# Patient Record
Sex: Female | Born: 1940 | Race: Asian | Hispanic: No | Marital: Married | Smoking: Former smoker
Health system: Southern US, Community
[De-identification: ages and names within clinical notes are randomized; demographics above are authoritative.]

## PROBLEM LIST (undated history)

## (undated) DIAGNOSIS — R7303 Prediabetes: Secondary | ICD-10-CM

## (undated) DIAGNOSIS — I442 Atrioventricular block, complete: Secondary | ICD-10-CM

## (undated) DIAGNOSIS — D649 Anemia, unspecified: Secondary | ICD-10-CM

## (undated) DIAGNOSIS — IMO0002 Reserved for concepts with insufficient information to code with codable children: Secondary | ICD-10-CM

## (undated) DIAGNOSIS — Z950 Presence of cardiac pacemaker: Secondary | ICD-10-CM

## (undated) DIAGNOSIS — N183 Chronic kidney disease, stage 3 unspecified: Secondary | ICD-10-CM

## (undated) DIAGNOSIS — K2970 Gastritis, unspecified, without bleeding: Secondary | ICD-10-CM

## (undated) DIAGNOSIS — I5022 Chronic systolic (congestive) heart failure: Secondary | ICD-10-CM

## (undated) DIAGNOSIS — R55 Syncope and collapse: Secondary | ICD-10-CM

## (undated) DIAGNOSIS — E039 Hypothyroidism, unspecified: Secondary | ICD-10-CM

## (undated) DIAGNOSIS — I2510 Atherosclerotic heart disease of native coronary artery without angina pectoris: Secondary | ICD-10-CM

## (undated) DIAGNOSIS — E119 Type 2 diabetes mellitus without complications: Secondary | ICD-10-CM

## (undated) DIAGNOSIS — I340 Nonrheumatic mitral (valve) insufficiency: Secondary | ICD-10-CM

## (undated) DIAGNOSIS — M5410 Radiculopathy, site unspecified: Secondary | ICD-10-CM

## (undated) DIAGNOSIS — I10 Essential (primary) hypertension: Secondary | ICD-10-CM

## (undated) DIAGNOSIS — R9430 Abnormal result of cardiovascular function study, unspecified: Secondary | ICD-10-CM

## (undated) DIAGNOSIS — K760 Fatty (change of) liver, not elsewhere classified: Secondary | ICD-10-CM

## (undated) DIAGNOSIS — I428 Other cardiomyopathies: Secondary | ICD-10-CM

## (undated) HISTORY — DX: Abnormal result of cardiovascular function study, unspecified: R94.30

## (undated) HISTORY — DX: Essential (primary) hypertension: I10

## (undated) HISTORY — DX: Fatty (change of) liver, not elsewhere classified: K76.0

## (undated) HISTORY — DX: Radiculopathy, site unspecified: M54.10

## (undated) HISTORY — DX: Anemia, unspecified: D64.9

## (undated) HISTORY — DX: Other cardiomyopathies: I42.8

## (undated) HISTORY — DX: Syncope and collapse: R55

## (undated) HISTORY — DX: Atrioventricular block, complete: I44.2

## (undated) HISTORY — PX: UPPER GI ENDOSCOPY: SHX6162

## (undated) HISTORY — PX: ROOT CANAL: SHX2363

## (undated) HISTORY — PX: CATARACT EXTRACTION: SUR2

## (undated) HISTORY — DX: Prediabetes: R73.03

## (undated) HISTORY — DX: Presence of cardiac pacemaker: Z95.0

## (undated) HISTORY — DX: Chronic kidney disease, stage 3 (moderate): N18.3

## (undated) HISTORY — DX: Hypothyroidism, unspecified: E03.9

## (undated) HISTORY — DX: Reserved for concepts with insufficient information to code with codable children: IMO0002

## (undated) HISTORY — DX: Nonrheumatic mitral (valve) insufficiency: I34.0

## (undated) HISTORY — DX: Chronic systolic (congestive) heart failure: I50.22

## (undated) HISTORY — PX: ESOPHAGOGASTRODUODENOSCOPY: SHX1529

## (undated) HISTORY — PX: INTRAOCULAR LENS INSERTION: SHX110

## (undated) HISTORY — DX: Gastritis, unspecified, without bleeding: K29.70

## (undated) HISTORY — DX: Type 2 diabetes mellitus without complications: E11.9

## (undated) HISTORY — PX: BREAST ENHANCEMENT SURGERY: SHX7

## (undated) HISTORY — DX: Chronic kidney disease, stage 3 unspecified: N18.30

## (undated) HISTORY — DX: Atherosclerotic heart disease of native coronary artery without angina pectoris: I25.10

---

## 1987-06-21 HISTORY — PX: TOTAL ABDOMINAL HYSTERECTOMY W/ BILATERAL SALPINGOOPHORECTOMY: SHX83

## 2001-10-30 ENCOUNTER — Inpatient Hospital Stay (HOSPITAL_COMMUNITY): Admission: EM | Admit: 2001-10-30 | Discharge: 2001-10-31 | Payer: Self-pay | Admitting: Emergency Medicine

## 2001-10-30 ENCOUNTER — Encounter: Payer: Self-pay | Admitting: Emergency Medicine

## 2001-10-31 ENCOUNTER — Encounter: Payer: Self-pay | Admitting: Internal Medicine

## 2003-06-21 DIAGNOSIS — Z950 Presence of cardiac pacemaker: Secondary | ICD-10-CM

## 2003-06-21 HISTORY — DX: Presence of cardiac pacemaker: Z95.0

## 2003-06-21 HISTORY — PX: PACEMAKER INSERTION: SHX728

## 2003-07-24 ENCOUNTER — Inpatient Hospital Stay (HOSPITAL_COMMUNITY): Admission: EM | Admit: 2003-07-24 | Discharge: 2003-07-26 | Payer: Self-pay

## 2003-07-24 ENCOUNTER — Encounter (INDEPENDENT_AMBULATORY_CARE_PROVIDER_SITE_OTHER): Payer: Self-pay | Admitting: *Deleted

## 2003-10-02 ENCOUNTER — Ambulatory Visit (HOSPITAL_COMMUNITY): Admission: RE | Admit: 2003-10-02 | Discharge: 2003-10-02 | Payer: Self-pay | Admitting: Family Medicine

## 2006-08-03 ENCOUNTER — Inpatient Hospital Stay (HOSPITAL_COMMUNITY): Admission: EM | Admit: 2006-08-03 | Discharge: 2006-08-10 | Payer: Self-pay | Admitting: Emergency Medicine

## 2006-08-03 ENCOUNTER — Ambulatory Visit: Payer: Self-pay | Admitting: Critical Care Medicine

## 2006-08-06 ENCOUNTER — Encounter (INDEPENDENT_AMBULATORY_CARE_PROVIDER_SITE_OTHER): Payer: Self-pay | Admitting: Interventional Cardiology

## 2008-03-18 ENCOUNTER — Encounter: Admission: RE | Admit: 2008-03-18 | Discharge: 2008-03-18 | Payer: Self-pay | Admitting: Cardiology

## 2009-06-19 ENCOUNTER — Emergency Department (HOSPITAL_BASED_OUTPATIENT_CLINIC_OR_DEPARTMENT_OTHER): Admission: EM | Admit: 2009-06-19 | Discharge: 2009-06-19 | Payer: Self-pay | Admitting: Emergency Medicine

## 2010-03-20 HISTORY — PX: DOPPLER ECHOCARDIOGRAPHY: SHX263

## 2010-04-06 ENCOUNTER — Encounter: Admission: RE | Admit: 2010-04-06 | Discharge: 2010-04-06 | Payer: Self-pay | Admitting: Family Medicine

## 2010-07-11 ENCOUNTER — Encounter: Payer: Self-pay | Admitting: Family Medicine

## 2010-09-20 LAB — DIFFERENTIAL
Eosinophils Absolute: 0.4 10*3/uL (ref 0.0–0.7)
Lymphocytes Relative: 34 % (ref 12–46)
Lymphs Abs: 2.5 10*3/uL (ref 0.7–4.0)
Monocytes Relative: 8 % (ref 3–12)
Neutro Abs: 4 10*3/uL (ref 1.7–7.7)
Neutrophils Relative %: 52 % (ref 43–77)

## 2010-09-20 LAB — CBC
MCV: 94.3 fL (ref 78.0–100.0)
Platelets: 273 10*3/uL (ref 150–400)
RBC: 3.77 MIL/uL — ABNORMAL LOW (ref 3.87–5.11)
WBC: 7.6 10*3/uL (ref 4.0–10.5)

## 2010-09-20 LAB — BASIC METABOLIC PANEL
Chloride: 105 mEq/L (ref 96–112)
Creatinine, Ser: 1.5 mg/dL — ABNORMAL HIGH (ref 0.4–1.2)
GFR calc Af Amer: 42 mL/min — ABNORMAL LOW (ref >60)
GFR calc non Af Amer: 35 mL/min — ABNORMAL LOW (ref >60)

## 2010-11-05 NOTE — Consult Note (Signed)
NAME:  Gabriela Phillips, Gabriela Phillips              ACCOUNT NO.:  [id number]   MEDICAL RECORD NO.:  [id number]          PATIENT TYPE:  INP   LOCATION:  2104                         FACILITY:  [facility]   PHYSICIAN:  Charlcie Cradle. Delford Field, MD, FCCPDATE OF BIRTH:  [birth year] 10, 1940   DATE OF CONSULTATION:  08/06/2006  DATE OF DISCHARGE:                                 CONSULTATION   CRITICAL CARE CONSULTATION   CHIEF COMPLAINT:  Respiratory failure.   HISTORY OF PRESENT ILLNESS:  [age]-year-old Asian female presented  to [facility] on February 14, with 3 days of nausea, vomiting and  diarrhea, was hypotensive initially.  She resuscitated well with volume.  She had been on an ACE inhibitor prior to this.  She was stabilized, but  on August 06, 2006, early a.m., she developed increased dyspnea  requiring mechanical ventilation, intubation, transferred to the ICU.  CT of chest did not reveal PE, it did show CHF with right renal pleural  effusions and bilateral asymmetric edema.  She has a history of syncopal  episodes, required pacemaker placement in February of 2005.  She is on  full vent support.   PAST MEDICAL HISTORY:  1. Syncope with pacemaker placement, February of 2005.  2. Hypertension, lisinopril.  3. Hyperlipidemia.  4. Former smoker.   SOCIAL HISTORY:  She smoked a pack a day for 30 years.  Does not drink.  Is currently not smoking.   FAMILY HISTORY:  Noncontributory.   REVIEW OF SYSTEMS:  Not available.   PHYSICAL EXAMINATION:  GENERAL:  Well-developed, well-nourished Asian  female, awake and alert in no distress on vent support.  Tidal volume is  450, rate of 12 PRVC, PEEP of 5, FIO2 0.40, SPO2 100%.  Peak pressure  27.  Plateau pressure 21.  Chest x-ray showed ET tube at good position,  right greater than left effusion, right lung opacity.  Chest CT negative  for PE.  CHEST EXAM:  Showed coarse rhonchi, a few rales.  CARDIAC EXAM:  Showed regular rate and rhythm.  No S3.  No  murmur.  ABDOMEN:  Soft.  Nontender.  Bowel sounds were active.  GU:  Foley intact.  EXTREMITIES:  Warm without edema.  HEENT EXAM:  Patient is orally intubated.  Also a note is made of blood  pressure of 127/70.   LABORATORY DATA:  Hemoglobin is 12.7, white count 14,000, sodium 139,  creatinine 0.8, blood sugar 110.  Troponin 0.4.  BNP 1110.  TSH 0.14.   IMPRESSION:  Is that of initial presentation of a viral type of  gastroenteritis with acute vomiting and diarrhea in a volume depletion  setting exacerbated by in the setting of ACE inhibitor use.  One  possibility is the patient may have been over resuscitated with volume  and then precipitated congestive failure.  Need to reevaluate patient's  left ventricular function.  Note is made of her cortisol level is normal  at 35 on admission.  Liver functions are also unremarkable in this  patient.  I doubt significant infectious etiology at this time, although  she is running a fever of 100 degrees as  noted and the white count has  come up now to 14,000, which is elevated.  Patient is currently on  Avalox IV only.   Plans for this patient will be maintain full vent support.  Culture  sputum.  Place central line to assess intravascular volume status of  CVP.  Maintain Protonix.  Maintain neb treatments.  Diurese as able.  Followup chest x-ray.      Charlcie Cradle Delford Field, MD, [facility]  Electronically Signed     PEW/MEDQ  D:  08/06/2006  T:  08/07/2006  Job:  [id number]   cc:   Kela Millin, M.D.

## 2010-11-05 NOTE — Cardiovascular Report (Signed)
NAME:  Gabriela Phillips, Gabriela Phillips                        ACCOUNT NO.:  [id number]   MEDICAL RECORD NO.:  [id number]                   PATIENT TYPE:  INP   LOCATION:  1823                                 FACILITY:  [facility]   PHYSICIAN:  Lesleigh Noe, M.D.            DATE OF BIRTH:  1940-08-10   DATE OF PROCEDURE:  07/24/2003  DATE OF DISCHARGE:                              CARDIAC CATHETERIZATION   PROCEDURE PERFORMED:  Temporary transvenous pacemaker via right femoral  artery.   CARDIOLOGIST:  Lyn Records, M.D.   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   DESCRIPTION OF PROCEDURE:  The patient was brought emergently to the cath  lab where the right iliofemoral region was sterilely prepped and draped.  Xylocaine was used for local anesthesia and a 6 French sheath was placed in  the right femoral vein using the modified Seldinger technique.   The patient tolerated the procedure without complications.   The pacemaker was placed using balloon floatation and fluoroscopic guidance.  Threshold of 0.75 mA was obtained.  The pacemaker was secured in the right  femoral vein region.  A sterile sheath was placed over the external portion  of the pacemaker wire and the pacer stabilized, and bandaged.   No complications occurred.   The patient left in a demand mode at a rate of 70 beats/minute with an mA of  4.   CONCLUSION:  Successful temporary transvenous pacemaker implantation into  the right ventricular apex using fluoroscopic guidance and a balloon-tipped  pacing wire with good threshold.   PLAN:  1. Transition to permanent pacemaker July 26, 2003 by Dr. Corliss Marcus.  2. Check thyroid function to rule out hyperthyroidism.  3. Discontinue beta blocker therapy.                                               Lesleigh Noe, M.D.    HWS/MEDQ  D:  07/24/2003  T:  07/25/2003  Job:  [id number]   cc:   Al Decant. Janey Greaser, MD  [address]  [place]  [place] [address]  Fax: [phone]

## 2010-11-05 NOTE — Discharge Summary (Signed)
NAME:  Gabriela Phillips, Gabriela Phillips              ACCOUNT NO.:  [id number]   MEDICAL RECORD NO.:  [id number]          PATIENT TYPE:  INP   LOCATION:  3738                         FACILITY:  [facility]   PHYSICIAN:  Hollice Espy, M.D.DATE OF BIRTH:  1940/10/12   DATE OF ADMISSION:  08/03/2006  DATE OF DISCHARGE:  08/10/2006                               DISCHARGE SUMMARY   PCP:  Dr. Janey Greaser of [place].   CONSULTS ON THIS CASE:  1. Dr. Amil Amen, Cardiology.  2. Dr. Danise Mina of Critical Care Medicine.   DISCHARGE DIAGNOSES:  1. Systolic congestive heart failure with acute exacerbation and      ejection fraction documented at 40%.  2. Hypertension.  3. 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  4. Hypothyroidism.  5. Gastroenteritis with secondary diarrhea.  6. Hypotension caused by gastroenteritis.  7. Hypokalemia, now resolved.  8. A history of hyperlipidemia.   DISCHARGE MEDICATIONS:  The patient will continue on all of her previous  medications.  These are as follows:  1. Synthroid 125 mcg p.o. daily.  2. Metoprolol 50 mg p.o. daily.  3. Premarin 0.45 mg p.o. daily.  4. Simvastatin 40 mg p.o. daily.  5. Lisinopril 10 p.o. daily.   In addition, she will be on Lasix 40 mg p.o. b.i.d. and K-Dur 20 mEq  p.o. daily.   HOSPITAL COURSE:  The patient is a 70 year old Asian female with a past  medical history of hypertension and hypothyroidism who was sent to the  emergency room on August 03, 2006 after having several episodes of  nausea, vomiting, and diarrhea.  She was found to be hypotensive and  admitted for gastroenteritis with secondary hypotension.  The patient  was put on supportive care, including aggressive IV fluids.  She  initially showed signs of improvement, but by hospital day 3, she was  short of breath, anxious, requiring O2.  Initially, the patient's blood  pressure was also found to be elevated.  Cardiac enzymes were  checked  and found to be unremarkable, but by exam, she was noted to have  expiratory wheezing.  There was a concern of whether or not the patient  had a pulmonary embolus.  She was sent for chest CT, but she became  tachypneic on arrival after the chest CT and unresponsive.  A code blue  was called and patient had required intubated.  On BiPAP she had  decreased O2 sats.  Critical Care Medicine was consulted.  Patient was  given doses of diuretics and she put out a significant amount of fluid.  Stat chest x-rays confirmed the patient was in signs of congestive heart  failure, a new diagnosis for her.  Patient was then managed by Critical  Care Medicine.  After Critical Care Medicine evaluated the patient and  took over the care of the patient, Cardiology, as well, was consulted  given this new diagnosis.  The patient had an EKG which showed paced  rhythm but appeared to be increased pulmonary edema.  A stat echo showed  a mild decrease in her left ventricular  function and an apical heart  rate.  Over the next several days, the patient was diuresed and weaned  off of her IV fluids.  Her final ejection fraction off of echo showed a  decreased ejection fraction of 40% with a mild left ventricular  dysfunction.  It was felt that patient would need a cardiac  catheterization done to define the etiology of her congestive heart  failure.  She underwent catheterization on August 07, 2006.  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  They felt that this  cardiomyopathy that was experiencing may be secondary to viral  infection, stress, or illness.  The patient continued to be hydrated,  and by February 19, she was doing well, stabilized, and transferred back  to the  hospitalist service.  There, a repeat x-ray showed some continued  signs of heart failure, her electrolytes remained stable, she was  continued to diurese, and over the next several days, her diuretics were  changed over to p.o.  A chest x-ray done on August 10, 2006 showed she  was completely diuresed at this point.  The patient had received medical  education about CHF as per the CHF protocol.  She was already on an ACE  inhibitor and beta blocker, and with the addition of Lasix, her blood  pressure has remained stable.  She had an episode of hyperkalemia with a  question of potassium size of 6.4; however, a hemolyzed specimen repeat  confirms that her potassium is normal on the day of discharge at 4.4.  TSH was checked and the patient will be continued on her same dose of  Synthroid.   PLAN:  Patient to follow up with her PCP as well as Dr. Amil Amen of  Cardiology next week.  She will also have home health PT/OT and an RN  for the CHF protocol.  PT and OT evaluated the patient here in the  hospital, and they felt that she did very well,, although they recommend  continued home health PT/OT.  The patient's overall disposition is  improved.  Her initial activity will be as per home PT/OT.  Her diet  will be a Heart Healthy diet.  She is being discharged to home.      Hollice Espy, M.D.  Electronically Signed     SKK/MEDQ  D:  08/10/2006  T:  08/11/2006  Job:  [id number]   cc:   Francisca December, M.D.  Dr. Janey Greaser

## 2010-11-05 NOTE — Discharge Summary (Signed)
[place]  Patient:    Gabriela Phillips, Gabriela Phillips Visit Number: [id number] MRN: [id number]          Service Type: MED Location: [phone] [phone] Attending Physician:  Jackie Plum Dictated by:   Jackie Plum, M.D. Admit Date:  10/30/2001 Discharge Date: 10/31/2001   CC:         Doran Clay, M.D.   Discharge Summary  DISCHARGE DIAGNOSES: 1. Chest pain with no evidence of myocardial ischemia.  Adenosine    Cardiolite was negative for any reversible ischemia done on Oct 31, 2001. 2. Hypertension - uncontrolled. 3. Hypothyroidism. 4. Status post left carotid surgery two weeks ago. 5. Allergic to PENICILLIN.  DISCHARGE LABORATORIES: WBC 7.1, hemoglobin 12.5, hematocrit 36.5, MCV 92.8, platelet count 301,000.  Protime 12.2, INR 8.8, PTT 39.0.  Sodium 140, potassium 3.8, chloride 105, C02 27, glucose 103, BUN 10, creatinine 0.5, bilirubin 0.4.  Alkaline phosphatase 59, SGOT 23, SGPT 19, calcium 9.1.  DISCHARGE MEDICATIONS: The patient is to go home and resume her preadmission medications which include: 1. Toprol XL 100 mg p.o. q.d. 2. Synthroid 100 mcg p.o. q.d. 3. Estrogen. 4. 1% prednisolone acetate one GGT to left eye q.4h. 5. 0.05% Acular left eye. 6. Ketorolac GGT q.2h to left eye.  ACTIVITIES:  As tolerated.  DIET:  Low salt diet.  SPECIAL INSTRUCTIONS:  The patient is to report to her medical doctor if she experiences any further chest pain or shortness of breath.  FOLLOW-UP: 1. Follow-up with Dr. Janey Greaser within one to two weeks. She is to call for    an appointment for follow-up of her hypertension.  HISTORY OF PRESENT ILLNESS: The patient is a 70 year old female with coronary artery disease with risks of age more than 55 years and hypertension who presented on Oct 30, 2001 with a history of chest pain.  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  There was no history of nausea, vomiting, diaphoresis, paroxysmal nocturnal dyspnea, orthopnea or dizziness was given.  This was associated with shortness of breath.  No known obvious contributing or alleviating factors were given at the time of evaluation.  She had associated left arm numbness without any obvious pain per say.  No history of cough, sputum production, fever or chills was given.  She went to see her primary care physician whereupon she was referred to the ED for further evaluation on account of new T wave inversions in V1 to V3.  At the time of my evaluation her blood pressure was 186/96 with a pulse rate of 77, a respiratory rate of 20 per minute and a temperature of 98.3 degrees Fahrenheit and an 02 sat of 96% on room air. General exam was remarkable for the absence of any acute cardiopulmonary distress.  She was not pale.  There was no JVD. There was no carotid bruit.  Lung exam was unremarkable.  Cardiac exam was benign.  There was no pedal edema and she was alert and oriented times three without any obvious focal neurologic deficit. Laboratories were notable for borderline cardiomegaly on x-ray.  EKG:  Normal sinus rhythm with A type pattern and T wave inversion in V1 to V3.  The patient was therefore admitted for management of chest pain to rule out a myocardial infarction/myocardidal ischemia.  HOSPITAL COURSE:  #1 - CHEST PAIN:  The patient was admitted to the medical service to a  telemetry bed.  Serial cardiac enzymes and twelve lead EKG in tandem were obtained. There was no evidence of a myocardial infarction during this evaluation.  Subsequently, Cardiology was called and they agreed to request a stress Cardiolite which was negative today.  At this juncture, it is unlikely that the cause was chest pain or was cardiac in origin and her chest pain has resolved spontaneously.  Hence, should this recur other causes of chest pain which would be  non-cardiac should be short.  #2 - UNCONTROLLED HYPERTENSION:  On admission the patient could not tell the dose of her Toprol XL at home so we opted to start her on 25 mg b.i.d. of metoprolol during her hospitalization with a low dose of Altace.  Of note during her hospitalization is that her blood pressures were uncontrolled with systolics running around 150 to 160.  The patient was advised to restart her home medication of Toprol XL at previous dose.  We opted to add low dose Altace 2.5 mg to her Toprol and to see her doctor within one to two weeks for evaluation of her hypertension and further control as needed.  #3 - HYPOTHYROIDISM:  The patients TSH during hospitalization was 2.797, which was normal.  She was clinically euthyroid and she is to continue her preadmission dose of Synthroid. Dictated by:   Jackie Plum, M.D. Attending Physician:  Jackie Plum DD:  10/31/01 TD:  11/03/01 Job: [id number]

## 2010-11-05 NOTE — H&P (Signed)
NAME:  Gabriela Phillips, Gabriela Phillips              ACCOUNT NO.:  [id number]   MEDICAL RECORD NO.:  [id number]          PATIENT TYPE:  INP   LOCATION:  2104                         FACILITY:  [facility]   PHYSICIAN:  Judie Petit, M.D. DATE OF BIRTH:  03/17/1941   DATE OF ADMISSION:  08/03/2006  DATE OF DISCHARGE:                              HISTORY & PHYSICAL   INTUBATION REPORT:  ENT team was called to emergently intubate a 70-year-  old female in respiratory distress.  Upon arrival to the room the  patient was not responding to verbal stimuli, being mask bag by RT.  The  case was briefly discussed with code medical resident.  Systolic blood  pressure was 160, heart rate 100.  O2 saturations low 90s.  Rapid  sequence intubation was high blood pressure with sodium pentothal 150 mg  and succinylcholine and 120 mg via left arm IV.  A No. 8 endotracheal  tube was placed on the initial attempt with a No. 3 Miller by CRNA Amy.  Positive bilateral breath sounds, positive End-Tidal CO2 by EZ cath.  Tube was secured by RT.  Bilateral breath sounds after intubation. O2  saturations 100%.  Blood pressure 107/67, heart rate 80.   PLAN:  1. Chest x-ray pending.  2. Tube management per medical code team attending.      Judie Petit, M.D.     CE/MEDQ  D:  08/06/2006  T:  08/06/2006  Job:  [id number]

## 2010-11-05 NOTE — Consult Note (Signed)
[place]  Patient:    Gabriela Phillips, Gabriela Phillips Visit Number: [id number] MRN: [id number]          Service Type: MED Location: 3700 [phone] Attending Physician:  Jackie Plum Dictated by:   Armanda Magic, M.D. Proc. Date: 10/30/01 Admit Date:  10/30/2001 Discharge Date: 10/31/2001   CC:         Dr. Janey Greaser   Consultation Report  DATE OF BIRTH:  Dec 17, 1940  REFERRING PHYSICIAN:  Dr. Janey Greaser  HISTORY OF PRESENT ILLNESS:  This is a 70 year old female with cardiac risk factors including age greater than 55 years, hypertension who presents with an episode of chest pain. The patient speaks broken [place] and is somewhat difficult to get a good history. Apparently earlier today, she, while standing up, developed substernal chest pressure with numbness in her arms, predominantly her right arm, and weakness of her legs. She says she just felt very tired. She also noted some palpitations and shortness of breath. She has had occasional exertional shortness of breath recently. She denied any diaphoresis but did have some nausea associated with the episode. She says the duration lasts about 10 minutes and then subsided but then resolved. She went to see her primary care physician where EKG showed right bundle branch block. She then presented to the emergency room where her initial cardiac enzymes were normal. She currently is pain free.  PAST MEDICAL HISTORY:  Significant for hypertension, hypothyroidism, status post left cataract surgery two weeks ago.  ALLERGIES:  PENICILLIN.  SOCIAL HISTORY:  She is married. She used to smoke but quit five years ago. No alcohol use.  FAMILY HISTORY:  No history of heart disease or CVA.  REVIEW OF SYSTEMS:  Otherwise, is negative.  PHYSICAL EXAMINATION:  VITAL SIGNS:  Blood pressure is 186/96, heart rate 77, O2 saturation are 96% on room air.  GENERAL:  This is a well-developed female in no acute  distress.  HEENT:  Benign.  NECK:  Supple without lymphadenopathy, carotid upstrokes are +2 bilaterally, no bruits.  LUNGS:  Clear to auscultation throughout.  HEART:  Regular rate and rhythm. No murmurs, rubs, or gallops. Normal S1, S2.  ABDOMEN:  Soft, nontender, nondistended with active bowel sounds. No hepatosplenomegaly.  EXTREMITIES:  No signs of erythema or edema, good distal pulses.  LABORATORY DATA:  Hematocrit 36.5, hemoglobin 12.5, white cell count 7.1, platelet count 310. INR is normal at 0.8, sodium 140, potassium 3.8, chloride 105, CO2 27, BUN 10, creatinine 0.7, calcium 9.1, glucose 103, protein 6.3, albumin 3.4, AST 23, ALT 19, alkaline phosphatase 59, total bilirubin 0.4, CPK 95, MB 1, troponin 0.02.  DIAGNOSTIC DATA:  EKG shows normal sinus rhythm with right bundle branch block. There are T wave inversions in V1 through V3. Most likely this is associated with repolarization changes from right bundle branch block. Of note, the EKG done at [facility] showed normal sinus rhythm with left ventricular hypertrophy. There was no evidence of right bundle branch block so the right bundle branch block is new.  IMPRESSION: 1. Chest pain with cardiac risk factors including age, hypertension,    remote tobacco abuse, and abnormal EKG with new right bundle branch    block since her previous one at [facility]. Her first set of    cardiac enzymes are negative. 2. Hypertension with elevated blood pressure.  PLAN:  I agree with admit to telemetry bed. Rule out myocardial infarction with serial enzymes.  Lovenox subcu. a second  set of enzymes becomes positive or she develops recurrent chest pain. Adenosine Cardiolite in the morning if enzymes are all negative. Aspirin q.d. and aggressive blood pressure control. Dictated by:   Armanda Magic, M.D. Attending Physician:  Jackie Plum DD:  10/30/01 TD:  10/31/01 Job: [id number]

## 2010-11-05 NOTE — Cardiovascular Report (Signed)
NAME:  Gabriela Phillips, Gabriela Phillips              ACCOUNT NO.:  [id number]   MEDICAL RECORD NO.:  [id number]          PATIENT TYPE:  INP   LOCATION:  2104                         FACILITY:  [facility]   PHYSICIAN:  Corky Crafts, MDDATE OF BIRTH:  10-22-1940   DATE OF PROCEDURE:  08/07/2006  DATE OF DISCHARGE:                            CARDIAC CATHETERIZATION   REFERRING PHYSICIANS:  1. Dr. Donnalee Curry.  2. Dr. Shan Levans.   PRIMARY CARDIOLOGIST:  Dr. Corliss Marcus.   PROCEDURES PERFORMED:  1. Left heart catheterization.  2. Left ventriculogram.  3. Coronary angiogram.  4. Abdominal aortogram.   OPERATOR:  Corky Crafts, MD   INDICATIONS:  Pulmonary edema with decreased LV function, acute systolic  heart failure.   PROCEDURE:  The risks and benefits of cardiac catheterization were  explained to the patient and her husband and informed consent was  obtained.  The patient was brought to the cath lab and placed on the  table.  She was prepped and draped in the usual sterile fashion.  Her  right groin was infiltrated for 1% lidocaine.  A 6-French arterial  sheath was placed into her right femoral artery using the modified  Seldinger technique.  Left coronary artery angiography was performed  using a JL-4.0 catheter.  The catheter was advanced to the vessel ostium  under fluoroscopic guidance.  Digital angiography was performed in  multiple projections using hand injection of contrast.  A right coronary  artery angiography was then performed using a JR-4.0 catheter.  The  catheter was advanced to the vessel ostium under fluoroscopic guidance.  Digital angiography was performed in multiple projections using hand  injection of contrast.  A pigtail catheter was advanced to the aortic  valve and across under fluoroscopic guidance.  The third-degree RAO  power injection of contrast was performed to visualize the left  ventricle.  The catheter was pulled back under continuous  hemodynamic  pressure monitoring.  Catheter was pulled back to the abdominal aorta.  A power injection of contrast was done in the AP projection to visualize  the renal arteries.  The sheath was removed and a Star[employer] was deployed  for hemostasis because the patient was recently extubated and was  coughing during the procedure; I did not want her to start bleeding  because of vigorous coughing.   FINDINGS:  The left main is widely patent.  The left circumflex is a  large vessel with minor irregularities.  The OM-1 and OM-2 are small  vessels.  The OM-3 is a medium-sized vessel with minor irregularities.  The left anterior descending is a large vessel with minor  irregularities.  The D-1 is a medium-sized vessel with minor  irregularities.  The D-2 is a small vessel.  The right coronary artery  is a medium-sized dominant vessel.  There are mild luminal  irregularities throughout, especially in the midportion of the vessel  and there is a large posterolateral branch and posterior descending  artery which come from the right coronary artery.   The left ventriculogram shows severe hypokinesis at the apex.  There is  normal movement  at the base of the heart.  The estimated ejection  fraction is 40%.  The abdominal aortogram shows mild atherosclerosis in  the abdominal aorta.  There are single renal arteries bilaterally; the  ostia appear patent   HEMODYNAMIC RESULTS:  Left ventricular pressure 137/13, LVEDP of 23  mmHg.  Aortic pressure of 140/70 with a mean aortic pressure of 100  mmHg.   IMPRESSION:  1. No significant coronary artery disease.  2. Significantly decreased left ventricular function with an estimated      ejection fraction of 40%.  There is apical hypokinesis.  3. Mildly increased left ventricular end-diastolic pressure.   RECOMMENDATIONS:  The patient should continue with aspirin and Zocor.  This nonischemic cardiomyopathy may be secondary to her viral infection  or  the overall stress of her illness.  When her blood pressure improves,  she will need to be treated with an ACE inhibitor and beta blocker.  I  would avoid excessive intravenous fluids at this point and rather than  hydrating her post catheterization, we will just hold her evening dose  of Lasix and continue judicious fluid management.      Corky Crafts, MD  Electronically Signed     JSV/MEDQ  D:  08/07/2006  T:  08/08/2006  Job:  [id number]

## 2010-11-05 NOTE — Op Note (Signed)
NAME:  Gabriela Phillips, Gabriela Phillips                        ACCOUNT NO.:  [id number]   MEDICAL RECORD NO.:  [id number]                   PATIENT TYPE:  INP   LOCATION:  2929                                 FACILITY:  [facility]   PHYSICIAN:  Francisca December, M.D.               DATE OF BIRTH:  [birth year]   DATE OF PROCEDURE:  07/25/2003  DATE OF DISCHARGE:                                 OPERATIVE REPORT   PROCEDURE:  Permanent pacemaker insertion.   INDICATIONS FOR PROCEDURE:  Mrs. Ozdemir is a 70 year old woman who was  admitted yesterday following  a syncopal spell resulting in a scalp  laceration. While under observation in the emergency room, she developed  spontaneous onset of 3rd degree AV block. This was with a very slow  junctional escape. A temporary pacing wire was placed via the right groin by  Dr. Katrinka Blazing. She was brought to the catheterization laboratory  now for  insertion of dual chamber permanent transvenous pacemaker.   DESCRIPTION OF PROCEDURE:  The patient was brought to the catheterization  laboratory  in a fasting state. The left prepectoral region was prepped and  draped in the usual sterile fashion. Local anesthesia was obtained with the  infiltration of 1% Lidocaine throughout.   A 6 to 7-mm incision was made in the deltopectoral groove and this was  carried down by sharp dissection and electrocautery to the prepectoral  fascia. There a plane was lifted and a pocket formed inferiorly and medially  using blunt dissection and electrocautery. The pocket was then packed with  1% kanamycin soaked gauze.   A left subclavian venogram was then performed utilizing the injection of 20  mL of Omnipaque. A digital cine angiogram was obtained and roadmapped to  guide future subclavian puncture. The venogram did demonstrate the vein to  be widely patent and coursing in a normal fashion over the anterior surface  of the 1st rib and beneath the middle 3rd of the clavicle. There was no  evidence for persistence of the left superior vena cava.   Two left subclavian vein  punctures were then performed using an 18 gauge  thin-wall needle through which was passed a 0.038 inch guide wire. Over the  initial guide wire a 7 French  tear-away sheath and dilator were advanced.  The dilator and wire were removed and the right ventricular lead was  advanced to the level of the right atrium. The sheath was torn away. Using  standard technique  and fluoroscopic landmarks, the lead was manipulated  into the right ventricular apex.   There excellent pacing parameters were obtained as will be noted below. The  lead was tested with diaphragmatic pacing at 10 volts and none was found.  The lead was then sutured into place using 3 separate 0 silk ligatures.   Over the remaining guide wire a 9 French  tear-away sheath and dilator was  advanced. The dilator was  removed. The wire was allowed to remain in place.  The atrial lead was advanced  to the level of the right atrium. The sheath  was torn away. Again using standard technique  and fluoroscopic landmarks,  the lead was manipulated into the right atrial appendage. This was an active  fixation lead, and after determining adequate pacing parameters, the screw  was advanced as appropriate.   Pacing parameters were again determined after fixation. These were adequate  and are noted below. It was tested for diaphragmatic pacing at 10 volts and  none was found. The lead was then sutured into place using 3 separate 0 silk  ligatures. A hemostasis figure-of-8 suture  was placed around the lead entry  sites into the pectoralis muscle. The remaining guide wire was removed as  well as the kanamycin soaked gauze from the pocket.   The pocket was then copiously irrigated using 1% kanamycin. The leads were  then attached to the pacing generator, carefully identifying each by its  serial number and attaching each to the appropriate receptacle in the   pacemaker. They were tightened into place and tested under the supervision  of the [employer] representative. The leads were then  wound beneath the  pacing generator and the generator was placed in the pocket.  The pocket was inspected for bleeding and none was found.   The pocket was then closed using 2-0 Dexon in a running fashion for the  subcutaneous layer. The skin was reapproximated using a 5-0 Dexon in a  running subcuticular fashion. Steri-Strips and a sterile dressing were  applied and the patient was transferred to the recovery area in stable  condition in an A sense V pace mode.   EQUIPMENT DATA:  The pacing generator is a [employer]onic model M6875398, serial  number [id number]. The atrial lead is a [employer]onic model U9629235, serial  number [id number]. The ventricular lead is a [employer]onic model M5895571, serial  number [id number].   PACING DATA:  The atrial lead detected a 2.9 millivolt P-wave. The pacing  threshold was 1.1 volts at capture threshold utilizing a pulse width of 0.5  milliseconds. The impendence was 591 ohms, resulting in a currented capture  threshold of 2.7 milliamps. The ventricular lead detected a 6.0 millivolt R-  wave. The pacing threshold was 0.5 volts at 0.5 milliseconds. The impendence  was 657 ohms, resulting in a currented capture threshold of 1.1 MA.                                               Francisca December, M.D.    JHE/MEDQ  D:  07/25/2003  T:  07/26/2003  Job:  [id number]   cc:   Lyn Records III, M.D.  [address]  [address]  [place]  [place] [address]  Fax: [phone]   Al Decant. Janey Greaser, MD  [address]  [place]  [place] [address]  Fax: [phone]

## 2010-11-05 NOTE — Discharge Summary (Signed)
NAME:  Gabriela Phillips, Gabriela Phillips                        ACCOUNT NO.:  [id number]   MEDICAL RECORD NO.:  [id number]                   PATIENT TYPE:  INP   LOCATION:  2004                                 FACILITY:  [facility]   PHYSICIAN:  Lyn Records, M.D.                DATE OF BIRTH:  [birth year]   DATE OF ADMISSION:  07/24/2003  DATE OF DISCHARGE:  07/26/2003                                 DISCHARGE SUMMARY   ADMISSION DIAGNOSES:  1. Third degree A-V block.  2. Hypothyroid.  3. History of hypertension.   DISCHARGE DIAGNOSES:  1. Third degree A-V block resolved with pacemaker implantation.  2. Permanent pacemaker placement July 25, 2003, dual-chamber secondary to     #1.  3. Hypothyroidism, stable.   PROCEDURES:  Placement of dual-chamber permanent transvenous pacemaker on  July 25, 2003, by Dr. Corliss Marcus.  Complications:  None.   CONDITION ON DISCHARGE:  Stable, improved.   ADMISSION HISTORY:  Please see complete H&P for details, but in short, this  is a 70 year old female with history of hypertension and hypothyroidism.  She was brought to the emergency room on July 24, 2003, by EMS after a  syncopal episode that resulted in a closed head injury with a contrecoup C  spine injury.  She received a laceration to the occiput which required  suturing.  She states, she has had approximately two-month history of  generalized lightheadedness, dizziness, and near syncope.  Initially was  thought to be having anxiety/panic attacks and was given benzodiazepine and  SSRI as treatment.  She was on Toprol 100 mg secondary to hypertension.  On  the day prior to admission, she did see her primary care physician, who  decreased her dose down to 50 mg secondary to bradycardia. On the day of  admission, she was at work, had onset again of dizziness and lightheadedness  and had an actual syncopal episode which caused her to fall backwards and  hit the back of her head.  In the emergency room,  she as noted to be  bradycardic with heart rate in the 50s.  Then she developed a complete heart  block with heart rate now in the 30s, occasionally dropping into the 20s.  She was also noted to be moderately hypotensive with this with systolic  blood pressure dropping into the 80s.   For physical examination on admission, please see complete H&P; however,  physical exam was essentially normal with the exception of marked  bradycardia and complete heart block noted on telemetry.  CT scan of her  head was negative for bleed,  C spine had been cleared by x-ray.  Chest x-  ray was normal.  Admission labs were normal.   HOSPITAL COURSE:  The patient was taken emergently to the cardiac  catheterization lab for insertion of a temporary pacemaker.  This was done  by Dr. Katrinka Blazing.  Plans for a permanent pacemaker  were made for the following  morning with Dr. Amil Amen.  TSH was also checked with history of  hypothyroidism.  Toprol was discontinued.  A 2-D echocardiogram as well as  carotid Dopplers were done.  Carotid Dopplers were completely normal.  Official/final report from 2-D echocardiogram performed from July 24, 2003, is not available at the time of this dictation.   On July 25, 2003, she was feeling much better.  Vital signs were stable.  Blood pressure had normalized.  Temporary pacemaker was in place and pacing  intermittently.  TSH was slightly low at 0.38.  This was day #3 of being off  beta blocker and still requiring frequent pacing secondary to bradycardia.  At this point, plans for dual-chamber pacemaker were made.  She was taken to  the catheterization lab on July 25, 2003, and had a dual-chamber PTVT  implanted by Dr. Corliss Marcus without complications.   On July 26, 2003, the pacemaker was interrogated by the [employer]  representative.  All values were  within normal limits, and no changes were  made since initial implantation.  The patient was discharged home later  that  day without further incident.   DISCHARGE MEDICATIONS:  1. Synthroid 0.1 mg daily, which was a new lower dose.  2. Toprol XL 50 mg daily.  3. Prozac 10 mg b.i.d.  4. Premarin 0.4 mg daily.  5. Lisinopril 10 mg daily.  6. Lorazepam 1 mg p.r.n.   DISCHARGE INSTRUCTIONS:  1. She was instructed to take Tylenol or Motrin p.r.n. for discomfort.  2. She is instructed not to lift anything heavy or carry her purse on her     left arm or do any pulling or tugging with her left arm.  3. She has been instructed to keep the wound clean and dry for the next     several days.  4. She may shower in two days and pat the area dry.  5. She is instructed to let the Steri-Strips fall off on their own.  6. She is to call to schedule an appointment to see Dr. Amil Amen and/or Dr.     Katrinka Blazing in two to three weeks.  She was given the office number to call.      Adrian Saran, N.P.                        Lyn Records, M.D.    HB/MEDQ  D:  08/12/2003  T:  08/12/2003  Job:  [id number]

## 2010-11-05 NOTE — Consult Note (Signed)
NAME:  AYAAN, RINGLE              ACCOUNT NO.:  [id number]   MEDICAL RECORD NO.:  [id number]          PATIENT TYPE:  INP   LOCATION:  2104                         FACILITY:  [facility]   PHYSICIAN:  Corky Crafts, MDDATE OF BIRTH:  1940-07-22   DATE OF CONSULTATION:  DATE OF DISCHARGE:                                 CONSULTATION   PRIMARY CARE PHYSICIAN:  Dr. Doran Clay.   REASON FOR CONSULTATION:  Pulmonary edema.   HISTORY OF PRESENT ILLNESS:  This history is obtained from the patient's  husband and the chart.  The patient is intubated.  The patient is a 70-  year-old  woman with a prior history of a pacemaker.  She has had a  negative stress test in the past.  She was admitted with viral  gastroenteritis.  She was hypotensive and therefore given aggressive  fluid resuscitation.  She developed pulmonary edema and was subsequently  intubated.  Her cardiac markers were elevated after the episode of  dyspnea and respiratory failure.  The patient had an echocardiogram  showing some apical hypokinesis which is new from 2005.  We are asked to  further evaluate.  The patient was fairly active, prior to all of this  according to her husband.  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  She was did experience some dyspnea on exertion.  Apparently, she was not having any chest pain.   PAST MEDICAL HISTORY:  1. Hypertension.  2. Hyperlipidemia.  3. Hypothyroidism.   ALLERGIES:  PENICILLIN.   MEDICATIONS AT HOME:  Lisinopril, Premarin, simvastatin and Synthroid.   SOCIAL HISTORY:  The patient lives with her husband.  She does not smoke  or drink alcohol.   FAMILY HISTORY:  Noncontributory.   REVIEW OF SYSTEMS:  She had not been losing weight, according to her  husband.  She was having some chills.  She was not having headaches,  chest pain or any diaphoresis.  She was feeling weak at time of  admission.  All other systems according to him were negative.  I  cannot  obtain a review of systems from the patient, because she is intubated.   PHYSICAL EXAM:  VITAL SIGNS:  Blood pressure 127/70 with a heart rate of  73.  GENERAL:  The patient is intubated, sedated.  HEAD:  Normocephalic, atraumatic.  She does open her eyes when  stimulated.  NECK:  Right IJ line in place, no obvious JVD.  CARDIOVASCULAR:  Regular rate and rhythm, S1-S2.  LUNGS:  Coarse breath sounds bilaterally.  ABDOMEN:  Soft, nontender, nondistended.  EXTREMITIES:  Showed no edema, 2+ right femoral pulse, 2+ dorsalis pedis  pulses bilaterally.  NEURO:  The patient seems to be moving all extremities when awake.  SKIN:  No rash.  The physical exam is somewhat limited, because she is unable to move in  bed.   LAB WORK:  Shows a BNP of 1,110.  Initial CK on the 14th was 91.  Subsequent CKs from yesterday and today are 289 and 234.  Initial  troponin on the 14th is  0.06.  Troponin is increased to 0.32 and 0.4.  MB initially was 3.2, has gone from 8.7, then to 9.1 this morning.  EKG  shows paced rhythm predominately.  There are occasional PVCs.  There are  no obvious ECG changes indicative of ischemia today.  Echo report shows  a poor hypokinesis with an EF of 40-45%.  This is decreased from prior  LVEF determined in 2005.   MEDICAL DECISION MAKING:  A 70 year old  with several risk factors for  heart disease, who now has decreased LV function with a apical  hypokinesis and pulmonary edema requiring intubation.   PLAN:  1. Cardiac.  The patient's enzymes are elevated.  This certainly could      be from ischemia, but other possibilities such as her pulmonary      edema and LV strain should be considered.  I do think it is      reasonable to perform a coronary angiogram because of her age and      risk factors.  The risks and benefits of cardiac catheterization      were explained to the patient's husband, and he agreed.  We will      have to determine the timing of this  procedure, in relation to when      she is extubated.  2. Eventually, she will need an ACE inhibitor and beta blocker long-      term for LV dysfunction.  3. Continue lipid-lowering therapy.  4. I will follow the patient while she is in the hospital      Corky Crafts, MD  Electronically Signed     JSV/MEDQ  D:  08/06/2006  T:  08/06/2006  Job:  [id number]

## 2010-11-05 NOTE — H&P (Signed)
NAME:  Gabriela Phillips, Gabriela Phillips              ACCOUNT NO.:  [id number]   MEDICAL RECORD NO.:  [id number]          PATIENT TYPE:  INP   LOCATION:  6730                         FACILITY:  [facility]   PHYSICIAN:  Jackie Plum, M.D.DATE OF BIRTH:  1940-11-10   DATE OF ADMISSION:  08/03/2006  DATE OF DISCHARGE:                              HISTORY & PHYSICAL   CHIEF COMPLAINT:  Nausea, vomiting, diarrhea.   HISTORY OF PRESENT ILLNESS:  This is a 70 year old Asian lady who  presented with several days' history of nausea, vomiting, diarrhea, with  generalized weakness.  She felt dizzy when standing up.  She had been a  little bit confused with chills without any fever.  On account of this,  the patient was brought to the emergency room.  In the ER the patient  was noted to be hypotensive, which would drop as low as 61/36.  Blood  work was obtained and she was admitted for further management after  initial resuscitation with IV fluids.   PAST MEDICAL HISTORY:  1. History of hypertension.  2. Dyslipidemia.  3. She is status post pacemaker insertion.   FAMILY HISTORY:  Noncontributory.   MEDICATION HISTORY:  The patient is allergic to PENICILLIN.   She is on lisinopril, Premarin, simvastatin and Synthroid for her  hypothyroidism.   SOCIAL HISTORY:  The patient lives with her husband.  Does not smoke  cigarettes nor drink alcohol.   REVIEW OF SYSTEMS:  Negative for weight loss.  Positive or chills and  dizziness but no fever.  She denies any visual changes.  No headaches.  No chest pain.  No diaphoresis.  No cough, sputum production or  wheezing.  No abdominal pain but positive for nausea, vomiting and  diarrhea.  No arthralgias, back pain, no rash or pruritus.  No  headaches.  Confusion was present initially according to the husband.  She feels generally weak.   PHYSICAL EXAMINATION:  VITAL SIGNS:  Blood pressure was 128/65, pulse  68, respirations 20, temperature 97.4 degrees Fahrenheit.  GENERAL:  The patient was not in acute distress.  HEENT:  Normocephalic, atraumatic.  Pupils equal, round, and reactive to  light.  Oropharynx slightly dry.  No exudation or erythema.  NECK:  Supple, no JVD.  LUNGS:  Clear to auscultation.  CARDIAC:  Regular, no gallops.  ABDOMEN:  Slight tenderness on deep palpation over the lower abdominal  area.  No rebound tenderness.  Bowel sounds were present.  They were  normoactive.  EXTREMITIES:  No cyanosis, no edema.  CENTRAL NERVOUS SYSTEM:  Nonfocal.   LABORATORY DATA:  Sodium 142, potassium 4.3, chloride 105, BUN 33,  glucose 103.  Venous pH 7.247, venous bicarbonate 20.3.  WBC count 12.1,  hemoglobin 13.1, hematocrit 39.2, platelet count 254, MCV 94.4.  Lactic  acid 2.8, slightly elevated due to hypotension in patient with  peripheral hypoperfusion, I believe.  D-dimer was elevated, 4.86 but no  clinical evidence of PE, and therefore we did not pursue a PE workup.  This was felt to be secondary to acute inflammatory process.  The  patient did not have any  shortness of breath or tachycardia.  Total  protein 4.9, albumin 2.8, AST 29, ALT 22, alkaline phosphatase 45, total  bilirubin 0.4.  BNP 32.0.  Total CPK 91, MB fraction 12.2.  Lipase 33.  Point of care cardiac markers negative.  Coagulation panel unremarkable.   IMPRESSION:  1. Gastroenteritis.  2. Dehydration with volume depletion secondary to gastroenteritis.  3. Hypotension due to gastroenteritis, resolved.   Patient admitted for IV fluid supplementation.  Will check a C.  difficile toxin and a stool for leukocytes.  Will check KUB.  Will check  a.m. cortisol and TSH.  Will check serial cardiac enzymes.  The patient  will be monitored on telemetry.  The patient needs to be monitored  carefully.  I doubt that the elevated D-dimer was due to PE or any  thromboembolism.  However, if there is any evidence of shortness of  breath or tachycardia, high suspicion for this needs to be  entertained  and at that point, it might be appropriate to get a CT angiogram.  If  her prerenal azotemia is still persistent, we may have to get a V/Q scan  at that time or possible extremity Doppler.      Jackie Plum, M.D.  Electronically Signed     GO/MEDQ  D:  08/04/2006  T:  08/04/2006  Job:  [id number]

## 2011-01-25 ENCOUNTER — Inpatient Hospital Stay (HOSPITAL_COMMUNITY): Payer: Medicare Other

## 2011-01-25 ENCOUNTER — Inpatient Hospital Stay (HOSPITAL_COMMUNITY)
Admission: AD | Admit: 2011-01-25 | Discharge: 2011-01-27 | DRG: 690 | Disposition: A | Discharge status: Home / Self Care | Payer: Medicare Other | Source: Ambulatory Visit | Attending: Internal Medicine | Admitting: Internal Medicine

## 2011-01-25 DIAGNOSIS — Z23 Encounter for immunization: Secondary | ICD-10-CM

## 2011-01-25 DIAGNOSIS — Z7982 Long term (current) use of aspirin: Secondary | ICD-10-CM

## 2011-01-25 DIAGNOSIS — I5022 Chronic systolic (congestive) heart failure: Secondary | ICD-10-CM | POA: Diagnosis present

## 2011-01-25 DIAGNOSIS — E785 Hyperlipidemia, unspecified: Secondary | ICD-10-CM | POA: Diagnosis present

## 2011-01-25 DIAGNOSIS — D72829 Elevated white blood cell count, unspecified: Secondary | ICD-10-CM | POA: Diagnosis present

## 2011-01-25 DIAGNOSIS — K219 Gastro-esophageal reflux disease without esophagitis: Secondary | ICD-10-CM | POA: Diagnosis present

## 2011-01-25 DIAGNOSIS — Z880 Allergy status to penicillin: Secondary | ICD-10-CM

## 2011-01-25 DIAGNOSIS — J329 Chronic sinusitis, unspecified: Secondary | ICD-10-CM | POA: Diagnosis present

## 2011-01-25 DIAGNOSIS — I2510 Atherosclerotic heart disease of native coronary artery without angina pectoris: Secondary | ICD-10-CM | POA: Diagnosis present

## 2011-01-25 DIAGNOSIS — N12 Tubulo-interstitial nephritis, not specified as acute or chronic: Principal | ICD-10-CM | POA: Diagnosis present

## 2011-01-25 DIAGNOSIS — I059 Rheumatic mitral valve disease, unspecified: Secondary | ICD-10-CM | POA: Diagnosis present

## 2011-01-25 DIAGNOSIS — E039 Hypothyroidism, unspecified: Secondary | ICD-10-CM | POA: Diagnosis present

## 2011-01-25 DIAGNOSIS — I509 Heart failure, unspecified: Secondary | ICD-10-CM | POA: Diagnosis present

## 2011-01-25 DIAGNOSIS — Z79899 Other long term (current) drug therapy: Secondary | ICD-10-CM

## 2011-01-25 DIAGNOSIS — Z950 Presence of cardiac pacemaker: Secondary | ICD-10-CM

## 2011-01-25 DIAGNOSIS — J40 Bronchitis, not specified as acute or chronic: Secondary | ICD-10-CM | POA: Diagnosis present

## 2011-01-25 LAB — URINALYSIS, ROUTINE W REFLEX MICROSCOPIC
Glucose, UA: NEGATIVE mg/dL
Specific Gravity, Urine: 1.014 (ref 1.005–1.030)
pH: 5 (ref 5.0–8.0)

## 2011-01-25 LAB — CBC
HCT: 28.3 % — ABNORMAL LOW (ref 36.0–46.0)
MCHC: 33.6 g/dL (ref 30.0–36.0)
MCV: 94.3 fL (ref 78.0–100.0)
Platelets: 219 10*3/uL (ref 150–400)
RDW: 13.6 % (ref 11.5–15.5)
WBC: 17.9 10*3/uL — ABNORMAL HIGH (ref 4.0–10.5)

## 2011-01-25 LAB — BASIC METABOLIC PANEL
BUN: 29 mg/dL — ABNORMAL HIGH (ref 6–23)
Chloride: 101 mEq/L (ref 96–112)
Creatinine, Ser: 1.46 mg/dL — ABNORMAL HIGH (ref 0.50–1.10)
GFR calc Af Amer: 43 mL/min — ABNORMAL LOW (ref >60)
GFR calc non Af Amer: 36 mL/min — ABNORMAL LOW (ref >60)
Potassium: 4.1 mEq/L (ref 3.5–5.1)

## 2011-01-25 LAB — DIFFERENTIAL
Basophils Absolute: 0 10*3/uL (ref 0.0–0.1)
Eosinophils Absolute: 0.1 10*3/uL (ref 0.0–0.7)
Eosinophils Relative: 1 % (ref 0–5)
Monocytes Absolute: 0.9 10*3/uL (ref 0.1–1.0)

## 2011-01-25 LAB — URINE MICROSCOPIC-ADD ON

## 2011-01-26 ENCOUNTER — Inpatient Hospital Stay (HOSPITAL_COMMUNITY): Payer: Medicare Other

## 2011-01-26 LAB — URINALYSIS, ROUTINE W REFLEX MICROSCOPIC
Bilirubin Urine: NEGATIVE
Glucose, UA: NEGATIVE mg/dL
Ketones, ur: NEGATIVE mg/dL
pH: 7.5 (ref 5.0–8.0)

## 2011-01-26 LAB — BASIC METABOLIC PANEL
BUN: 23 mg/dL (ref 6–23)
CO2: 26 mEq/L (ref 19–32)
Chloride: 102 mEq/L (ref 96–112)
Creatinine, Ser: 0.98 mg/dL (ref 0.50–1.10)
GFR calc Af Amer: >60 mL/min (ref >60)

## 2011-01-26 LAB — CBC
HCT: 30.4 % — ABNORMAL LOW (ref 36.0–46.0)
MCV: 94.1 fL (ref 78.0–100.0)
RDW: 13.6 % (ref 11.5–15.5)
WBC: 14.4 10*3/uL — ABNORMAL HIGH (ref 4.0–10.5)

## 2011-01-26 LAB — SEDIMENTATION RATE: Sed Rate: 67 mm/hr — ABNORMAL HIGH (ref 0–22)

## 2011-01-26 LAB — URINE MICROSCOPIC-ADD ON

## 2011-01-26 LAB — C-REACTIVE PROTEIN: CRP: 27.2 mg/dL — ABNORMAL HIGH (ref <0.6)

## 2011-01-27 LAB — BASIC METABOLIC PANEL
CO2: 24 mEq/L (ref 19–32)
Chloride: 104 mEq/L (ref 96–112)
GFR calc Af Amer: >60 mL/min (ref >60)
Sodium: 139 mEq/L (ref 135–145)

## 2011-01-27 LAB — CBC
Platelets: 234 10*3/uL (ref 150–400)
RBC: 3.15 MIL/uL — ABNORMAL LOW (ref 3.87–5.11)
WBC: 10.7 10*3/uL — ABNORMAL HIGH (ref 4.0–10.5)

## 2011-01-28 LAB — URINE CULTURE: Culture  Setup Time: 201208081116

## 2011-01-29 NOTE — H&P (Signed)
NAMEJALYNNE, PERSICO NO.:  [id number]  MEDICAL RECORD NO.:  [id number]  LOCATION:  3730                         FACILITY:  [facility]  PHYSICIAN:  Tarry Kos, MD       DATE OF BIRTH:  [birth year]  DATE OF ADMISSION:  01/25/2011 DATE OF DISCHARGE:                             HISTORY & PHYSICAL   CHIEF COMPLAINT:  Fever.  HISTORY OF PRESENT ILLNESS:  Ms. Propst is a 70 year old female who is a direct admit today from her primary care physician's office, Dr. Clarene Duke for presenting to his office with subjective fevers, a white count of 20, and a new cardiac murmur.  Ms. Riva states she has been having upper respiratory tract sinus drainage for a month now.  She has not recently had any antibiotics, but she says that for months now she has had copious amounts of nasal drainage that has been greenish/yellowish in color.  She has been feeling under the weather. She did have a fever of 101.  It was mainly concerning that her white count was 22 today with a significant left shift in her primary care physician's office and it was noted she had a new murmur.  She has a pacemaker lead, so it was concerning she had endocarditis.  The patient denies any chest pain.  The patient denies any nausea.  She denies any nausea, vomiting, abdominal pain.  She denies any dysuria.  REVIEW OF SYSTEMS:  Otherwise, negative.  The only significant pertinent finding is her upper respiratory nasal congestion for a month.  PAST MEDICAL HISTORY:  She has gotten a history of heart failure according to the patient, nonobstructive coronary artery disease.  She is status post pacemaker placement for complete heart block and syncope in the past, hyperlipidemia, hypothyroidism, hypertension.  She has total abdominal hysterectomy and status post  bilateral salpingo- oophorectomy.  She has a history respiratory failure in the past secondary to congestive heart failure exacerbation in  2008.  MEDICATIONS:  Med rec sheet is not done, unsure of her home.  According to her PCP office note, vitamin B12, aspirin, fish oil, nitroglycerin, calcium plus D, KCl, carvedilol, Lasix, levothyroxine, atorvastatin, Ativan, Benicar.  ALLERGIES:  PENICILLIN causes her to pass out and her stools turn black.  PHYSICAL EXAMINATION:  VITAL SIGNS:  Temperatures 99.7, pulse 84, respirations 16, blood pressure is 104/58, 94% O2 sats on room air. GENERAL:  She is alert and oriented x4, no apparent distress, cooperative, friendly. HEENT:  Extraocular muscles intact.  Pupils equal and reactive to light. Oropharynx is clear.  Mucous membranes are moist. NECK:  No JVD.  No carotid bruits. COR:  Regular rate and rhythm without murmurs, rubs, or gallops. CHEST:  Clear to auscultation bilaterally.  No wheezes, rhonchi, or rales. ABDOMEN:  Soft, nontender, nondistended.  Positive bowel sounds.  No hepatosplenomegaly. EXTREMITIES:  No clubbing, cyanosis or edema. PSYCH:  Normal affect. NEURO:  No focal neurologic deficits. SKIN:  No rashes. LABORATORY DATA:  Per PCP office, her white count is 22, hemoglobin is 11.2.  Chest x-ray was reported as negative.  ALT normal.  ASSESSMENT/PLAN:  This is a 70 year old female with fever of unknown source. 1. Fever  of unknown source.  I do not appreciate a murmur, but we will     check an echo to check her valves.  I am not going to start any     antibiotics at this time and we will obtain blood cultures if she     does run a fever.  If anything, she does sound like she has some     sinusitis issues for the last month that has not been treated.     Also, we will obtain CT of her sinuses.  We will also check the     urinalysis to make sure that that is not the source of her fever     either.  I do not see that a urinalysis was done in her PCP's     office. 2. History of congestive heart failure.  This is stable at this time. 3. The patient is full  code. 4. Further recommendation pending overall hospital course.          ______________________________ Tarry Kos, MD     RD/MEDQ  D:  01/25/2011  T:  01/26/2011  Job:  [id number]  Electronically Signed by Tarry Kos MD on 01/29/2011 05:44:04 PM

## 2011-02-01 LAB — CULTURE, BLOOD (ROUTINE X 2)
Culture  Setup Time: 201208081556
Culture  Setup Time: 201208081557
Culture: NO GROWTH
Culture: NO GROWTH

## 2011-02-01 NOTE — Discharge Summary (Signed)
[name], Phillips              ACCOUNT NO.:  [id number]  MEDICAL RECORD NO.:  [id number]  LOCATION:  3730                         FACILITY:  [facility]  PHYSICIAN:  Lonia Blood, M.D.       DATE OF BIRTH:  [birth year]  DATE OF ADMISSION:  01/25/2011 DATE OF DISCHARGE:  01/27/2011                              DISCHARGE SUMMARY   PRIMARY CARE PHYSICIAN:  Caryn Bee L. Little, MD  DISCHARGE DIAGNOSES: 1. Urinary tract infection with probably mild incipient right     pyelonephritis. 2. Mild sinusitis. 3. Bronchitis. 4. Gastroesophageal reflux disease. 5. Coronary artery disease. 6. Complete heart block, status post pacemaker placement. 7. Mild mitral regurgitation by echocardiogram. 8. Hyperlipidemia. 9. Hypothyroidism. 10.Status post hysterectomy. 11.Chronic systolic congestive heart failure, stable.  DISCHARGE MEDICATIONS: 1. Benicar 10 mg every other day. 2. Fish oil 2700 mg daily. 3. Robitussin-DM 5 mL every 4 hours as needed for cough. 4. Ativan 1 mg three times a day as needed for anxiety. 5. Coreg 6.25 mg twice a day. 6. Lipitor 20 mg daily. 7. Lasix 40 mg daily. 8. Nitroglycerin 0.4 mg under the tongue every 5 minutes as needed for     chest pain. 9. Aspirin 162 mg daily. 10.Omeprazole 20 mg daily. 11.Levaquin 500 mg for 6 days. 12.Calcium and vitamin D 1 tablet daily. 13.Potassium 20 mEq daily. 14.Levothyroxine 50 mcg daily. 15.Vitamin B12 500 mcg daily.  CONDITION ON DISCHARGE:  Gabriela Phillips was discharged in good condition.  Temperature 98.6, heart rate 76, respiration 18, blood pressure 133/75, saturation 95% on room air.  Discharging white blood cell count is 10.7.  The patient will follow up with her primary care physician, Dr. Catha Phillips.  PROCEDURE DURING THIS ADMISSION: 1. The patient underwent a transthoracic echocardiogram on January 26, 2011, findings of ejection fraction 60-65%, pulmonary artery     pressure 37 mmHg, mitral valve with  mild-to-moderate regurgitation.     No clear vegetations. 2. Blood cultures results pending. 3. Urine culture growing gram-negative rods. 4. Chest x-ray, posterolateral and lateral findings of no active     disease. 5. CT of the sinuses showing ethmoid and maxillary sinus     mucoperiosteal thickening, but no findings of acute sinusitis.  CONSULTATION THIS ADMISSION:  The patient was seen in consultation by Dr. Eldridge Dace from Cardiology.  HISTORY AND PHYSICAL:  Refer to dictation H and P done by Dr. Onalee Hua.  HOSPITAL COURSE:  Gabriela Phillips is a 70 year old woman with multiple medical problems was found to have fever and a white count of 20,000 at the primary care physician's office.  She was referred for admission due to the fact she was having also a new murmur.  Upon examination of the patient in the hospital, she was complaining of some right-sided flank pain.  She was also coughing.  We proceeded with urine cultures, blood cultures, and a chest x-ray.  The findings so far point towards possible early right-sided pyelonephritis.  The patient was treated empirically with Levaquin with improvement in her leukocytosis and fever. Echocardiogram was done for the "new murmur" and it was found out the patient actually was having mild mitral regurgitation even before.  Dr. Eldridge Dace could not see any vegetation on transthoracic echocardiogram. Since the patient was improving with Levaquin, we felt that the possibility of endocarditis is slim, so we discharged her home on oral antibiotics.  Final results of the blood cultures will be followed, and if they turned positive, then she will be referred to Dr. Eldridge Dace for outpatient transesophageal echocardiogram, PICC line will be placed, and she will be started on IV antibiotics.     Lonia Blood, M.D.     SL/MEDQ  D:  01/28/2011  T:  01/28/2011  Job:  [id number]  cc:   Caryn Bee L. Little, M.D. Corky Crafts, MD  Electronically Signed  by Lonia Blood M.D. on 02/01/2011 05:45:19 PM

## 2011-06-22 ENCOUNTER — Encounter: Payer: Self-pay | Admitting: Internal Medicine

## 2011-06-24 ENCOUNTER — Encounter: Payer: Self-pay | Admitting: *Deleted

## 2011-06-24 ENCOUNTER — Ambulatory Visit (INDEPENDENT_AMBULATORY_CARE_PROVIDER_SITE_OTHER): Payer: Medicare Other | Admitting: Internal Medicine

## 2011-06-24 ENCOUNTER — Encounter (HOSPITAL_COMMUNITY): Payer: Self-pay | Admitting: Pharmacy Technician

## 2011-06-24 ENCOUNTER — Encounter: Payer: Self-pay | Admitting: Internal Medicine

## 2011-06-24 VITALS — BP 108/78 | HR 65 | Ht 60.0 in | Wt 156.8 lb

## 2011-06-24 DIAGNOSIS — I442 Atrioventricular block, complete: Secondary | ICD-10-CM | POA: Insufficient documentation

## 2011-06-24 DIAGNOSIS — Z950 Presence of cardiac pacemaker: Secondary | ICD-10-CM

## 2011-06-24 DIAGNOSIS — I429 Cardiomyopathy, unspecified: Secondary | ICD-10-CM | POA: Insufficient documentation

## 2011-06-24 DIAGNOSIS — I428 Other cardiomyopathies: Secondary | ICD-10-CM

## 2011-06-24 LAB — CBC WITH DIFFERENTIAL/PLATELET
Basophils Absolute: 0 10*3/uL (ref 0.0–0.1)
Eosinophils Absolute: 0.3 10*3/uL (ref 0.0–0.7)
Eosinophils Relative: 3.7 % (ref 0.0–5.0)
HCT: 39.5 % (ref 36.0–46.0)
Lymphs Abs: 2.1 10*3/uL (ref 0.7–4.0)
MCHC: 32.7 g/dL (ref 30.0–36.0)
MCV: 98 fl (ref 78.0–100.0)
Monocytes Absolute: 0.4 10*3/uL (ref 0.1–1.0)
Neutrophils Relative %: 62.9 % (ref 43.0–77.0)
Platelets: 239 10*3/uL (ref 150.0–400.0)
RDW: 14.4 % (ref 11.5–14.6)
WBC: 7.6 10*3/uL (ref 4.5–10.5)

## 2011-06-24 LAB — BASIC METABOLIC PANEL
BUN: 25 mg/dL — ABNORMAL HIGH (ref 6–23)
Chloride: 107 mEq/L (ref 96–112)
Creatinine, Ser: 1.4 mg/dL — ABNORMAL HIGH (ref 0.4–1.2)
Glucose, Bld: 104 mg/dL — ABNORMAL HIGH (ref 70–99)

## 2011-06-24 LAB — PROTIME-INR
INR: 0.9 ratio (ref 0.8–1.0)
Prothrombin Time: 10.4 s (ref 10.2–12.4)

## 2011-06-24 NOTE — Assessment & Plan Note (Signed)
Her pacemaker has reverted to VVI at [facility]. She is quite symptomatic with this and would like to go for sooner rather than later pacemaker generator replacement. Dr. Ladona Ridgel has a slot available on Monday and we will schedule her for that procedure.  I reviewed with her the potential benefits and risks including but not limited to infection. With normalization of left ventricular function, we would not undertake CRT upgrade. We discussed this. She is only concerned about the lateral position of her pacemaker and we did discuss trying to move it more medially.

## 2011-06-24 NOTE — Patient Instructions (Signed)
Your physician has recommended that you have a pacemaker generator change. Please see the instruction sheet given to you today for more information.    

## 2011-06-24 NOTE — Progress Notes (Signed)
HPI  Gabriela Phillips is a 71 y.o. female Seen at the reque  She underwent pacemaker generator insertion in 2005 when she presented with third-degree AV block.  In 2008 she underwent catheterization which he presented with left ventricular dysfunction and congestive heart failure symptoms. 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 There has subtotally been normalization of left ventricular function assessed both by SPECT scanning as well as by echo December 2012.  She has chronic shortness of breath. This is modest. She became suddenly worse in the early part of December. She is noted now to have reverted to VVI pacing on December 12.  Past Medical History  Diagnosis Date  . Cardiomyopathy 2009    non-ischemic; resolved EF 75%; GSPECT  . CAD (coronary artery disease)     non-obstructive; EF 40% in 2008  . Systolic heart failure   . Hyperlipidemia   . Hypothyroidism   . Hypertension   . Pacemaker 2005    Medtronic [id number] for third-degree AV block  . Radiculopathy     followed by Dr. Terrace Arabia    Past Surgical History  Procedure Date  . Pacemaker insertion 2005    complete heart block  . Breast enhancement surgery   . Doppler echocardiography 03/2010    with normal left ventricular function, ejection fraction 68%, apical akinesis and mild mitral regurgitation    Current Outpatient Prescriptions  Medication Sig Dispense Refill  . aspirin 81 MG tablet Take 160 mg by mouth daily.        Marland Kitchen atorvastatin (LIPITOR) 20 MG tablet Take 20 mg by mouth daily.        . carvedilol (COREG) 6.25 MG tablet Take 6.25 mg by mouth 2 (two) times daily with a meal.        . cyanocobalamin 500 MCG tablet Take 500 mcg by mouth daily.        . furosemide (LASIX) 40 MG tablet Take 60 mg by mouth.        . levothyroxine (SYNTHROID, LEVOTHROID) 50 MCG tablet Take 50 mcg by mouth daily.        Marland Kitchen LORazepam (ATIVAN) 1 MG tablet Take one tablet four times daily       . nitroGLYCERIN (NITROSTAT) 0.4  MG SL tablet Place 0.4 mg under the tongue every 5 (five) minutes as needed. For chest pain      . olmesartan (BENICAR) 20 MG tablet Take 10 mg by mouth every other day.        . Omega-3 Fatty Acids (RA FISH OIL) 900 MG CAPS Take 2 capsules by mouth daily.        . potassium chloride SA (K-DUR,KLOR-CON) 20 MEQ tablet Take 20 mEq by mouth daily.          Allergies  Allergen Reactions  . Penicillins Other (See Comments)    Dark stool   History  Substance Use Topics  . Smoking status: Former Smoker    Quit date: 06/23/1997  . Smokeless tobacco: Never Used  . Alcohol Use: No   She is married.  Review of Systems negative except from HPI and PMH  Physical Exam Well developed and well nourished older [place] female appearing her stated age in no acute distress HENT normal E scleral and icterus clear Neck Supple Pacemaker pocket somewhat lateral and   into the shoulder region JVP 7-8 cm; carotids brisk and full Clear to ausculation Status post bilateral breast implant regular rate and rhythm Regular rate and rhythm, no murmurs gallops  or rub Soft with active bowel sounds No clubbing cyanosis none Edema Alert and oriented, grossly normal motor and sensory function Skin Warm and Dry  Ventricularly paced with underlying sinus rhythm complete heart block  Assessment and  Plan

## 2011-06-24 NOTE — Assessment & Plan Note (Signed)
This was identified with presentation of acute congestive heart failure 2008. This i has subsequently

## 2011-06-24 NOTE — Assessment & Plan Note (Signed)
Stable post device implantation

## 2011-06-26 MED ORDER — VANCOMYCIN HCL IN DEXTROSE 1-5 GM/200ML-% IV SOLN
1000.0000 mg | INTRAVENOUS | Status: AC
  Filled 2011-06-26: qty 200

## 2011-06-26 MED ORDER — SODIUM CHLORIDE 0.9 % IR SOLN
80.0000 mg | Status: AC
  Filled 2011-06-26 (×3): qty 2

## 2011-06-27 ENCOUNTER — Ambulatory Visit (HOSPITAL_COMMUNITY)
Admission: RE | Admit: 2011-06-27 | Discharge: 2011-06-27 | Disposition: A | Discharge status: Home / Self Care | Payer: Medicare Other | Source: Ambulatory Visit | Attending: Internal Medicine | Admitting: Internal Medicine

## 2011-06-27 ENCOUNTER — Encounter (HOSPITAL_COMMUNITY)
Admission: RE | Disposition: A | Discharge status: Home / Self Care | Payer: Self-pay | Source: Ambulatory Visit | Attending: Internal Medicine

## 2011-06-27 DIAGNOSIS — I10 Essential (primary) hypertension: Secondary | ICD-10-CM | POA: Insufficient documentation

## 2011-06-27 DIAGNOSIS — Z950 Presence of cardiac pacemaker: Secondary | ICD-10-CM

## 2011-06-27 DIAGNOSIS — R0602 Shortness of breath: Secondary | ICD-10-CM | POA: Insufficient documentation

## 2011-06-27 DIAGNOSIS — I442 Atrioventricular block, complete: Secondary | ICD-10-CM

## 2011-06-27 DIAGNOSIS — Z45018 Encounter for adjustment and management of other part of cardiac pacemaker: Secondary | ICD-10-CM | POA: Insufficient documentation

## 2011-06-27 HISTORY — PX: PACEMAKER GENERATOR CHANGE: SHX5481

## 2011-06-27 LAB — GLUCOSE, CAPILLARY: Glucose-Capillary: 103 mg/dL — ABNORMAL HIGH (ref 70–99)

## 2011-06-27 SURGERY — PACEMAKER GENERATOR CHANGE
Anesthesia: LOCAL

## 2011-06-27 MED ORDER — LORAZEPAM 1 MG PO TABS: 1.0000 mg | ORAL_TABLET | Freq: Four times a day (QID) | ORAL | Status: DC | PRN

## 2011-06-27 MED ORDER — OLMESARTAN 10 MG HALF TABLET: 10.0000 mg | ORAL_TABLET | ORAL | Status: DC

## 2011-06-27 MED ORDER — SODIUM CHLORIDE 0.9 % IV SOLN: INTRAVENOUS | Status: DC

## 2011-06-27 MED ORDER — LEVOTHYROXINE SODIUM 50 MCG PO TABS: 50.0000 ug | ORAL_TABLET | Freq: Every day | ORAL | Status: DC

## 2011-06-27 MED ORDER — POTASSIUM CHLORIDE CRYS ER 20 MEQ PO TBCR: 20.0000 meq | EXTENDED_RELEASE_TABLET | Freq: Every day | ORAL | Status: DC

## 2011-06-27 MED ORDER — FUROSEMIDE 20 MG PO TABS: 60.0000 mg | ORAL_TABLET | Freq: Every day | ORAL | Status: DC

## 2011-06-27 MED ORDER — MIDAZOLAM HCL 2 MG/2ML IJ SOLN
INTRAMUSCULAR | Status: AC
  Filled 2011-06-27: qty 2

## 2011-06-27 MED ORDER — CARVEDILOL 6.25 MG PO TABS: 6.2500 mg | ORAL_TABLET | Freq: Two times a day (BID) | ORAL | Status: DC

## 2011-06-27 MED ORDER — FENTANYL CITRATE 0.05 MG/ML IJ SOLN
INTRAMUSCULAR | Status: AC
  Filled 2011-06-27: qty 2

## 2011-06-27 MED ORDER — CYANOCOBALAMIN 500 MCG PO TABS: 500.0000 ug | ORAL_TABLET | Freq: Every day | ORAL | Status: DC

## 2011-06-27 MED ORDER — SIMVASTATIN 40 MG PO TABS: 40.0000 mg | ORAL_TABLET | Freq: Every day | ORAL | Status: DC

## 2011-06-27 MED ORDER — MUPIROCIN 2 % EX OINT
TOPICAL_OINTMENT | Freq: Two times a day (BID) | CUTANEOUS | Status: DC
  Administered 2011-06-27: 12:00:00 via NASAL

## 2011-06-27 MED ORDER — DIAZEPAM 5 MG PO TABS
ORAL_TABLET | ORAL | Status: AC
  Filled 2011-06-27: qty 1

## 2011-06-27 MED ORDER — LIDOCAINE HCL (PF) 1 % IJ SOLN
INTRAMUSCULAR | Status: AC
  Filled 2011-06-27: qty 60

## 2011-06-27 MED ORDER — DIAZEPAM 5 MG PO TABS
5.0000 mg | ORAL_TABLET | Freq: Once | ORAL | Status: AC
  Administered 2011-06-27: 5 mg via ORAL

## 2011-06-27 MED ORDER — MUPIROCIN 2 % EX OINT
TOPICAL_OINTMENT | CUTANEOUS | Status: AC
  Filled 2011-06-27: qty 22

## 2011-06-27 MED ORDER — SODIUM CHLORIDE 0.9 % IR SOLN
80.0000 mg | Status: DC
  Filled 2011-06-27: qty 2

## 2011-06-27 NOTE — H&P (View-Only) (Signed)
HPI  Gabriela Phillips is a 70 y.o. female Seen at the reque  She underwent pacemaker generator insertion in 2005 when she presented with third-degree AV block.  In 2008 she underwent catheterization which he presented with left ventricular dysfunction and congestive heart failure symptoms. 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 There has subtotally been normalization of left ventricular function assessed both by SPECT scanning as well as by echo December 2012.  She has chronic shortness of breath. This is modest. She became suddenly worse in the early part of December. She is noted now to have reverted to VVI pacing on December 12.  Past Medical History  Diagnosis Date  . Cardiomyopathy 2009    non-ischemic; resolved EF 75%; GSPECT  . CAD (coronary artery disease)     non-obstructive; EF 40% in 2008  . Systolic heart failure   . Hyperlipidemia   . Hypothyroidism   . Hypertension   . Pacemaker 2005    Medtronic [id number] for third-degree AV block  . Radiculopathy     followed by Dr. Yan    Past Surgical History  Procedure Date  . Pacemaker insertion 2005    complete heart block  . Breast enhancement surgery   . Doppler echocardiography 03/2010    with normal left ventricular function, ejection fraction 68%, apical akinesis and mild mitral regurgitation    Current Outpatient Prescriptions  Medication Sig Dispense Refill  . aspirin 81 MG tablet Take 160 mg by mouth daily.        . atorvastatin (LIPITOR) 20 MG tablet Take 20 mg by mouth daily.        . carvedilol (COREG) 6.25 MG tablet Take 6.25 mg by mouth 2 (two) times daily with a meal.        . cyanocobalamin 500 MCG tablet Take 500 mcg by mouth daily.        . furosemide (LASIX) 40 MG tablet Take 60 mg by mouth.        . levothyroxine (SYNTHROID, LEVOTHROID) 50 MCG tablet Take 50 mcg by mouth daily.        . LORazepam (ATIVAN) 1 MG tablet Take one tablet four times daily       . nitroGLYCERIN (NITROSTAT) 0.4  MG SL tablet Place 0.4 mg under the tongue every 5 (five) minutes as needed. For chest pain      . olmesartan (BENICAR) 20 MG tablet Take 10 mg by mouth every other day.        . Omega-3 Fatty Acids (RA FISH OIL) 900 MG CAPS Take 2 capsules by mouth daily.        . potassium chloride SA (K-DUR,KLOR-CON) 20 MEQ tablet Take 20 mEq by mouth daily.          Allergies  Allergen Reactions  . Penicillins Other (See Comments)    Dark stool   History  Substance Use Topics  . Smoking status: Former Smoker    Quit date: 06/23/1997  . Smokeless tobacco: Never Used  . Alcohol Use: No   She is married.  Review of Systems negative except from HPI and PMH  Physical Exam Well developed and well nourished older [place] female appearing her stated age in no acute distress HENT normal E scleral and icterus clear Neck Supple Pacemaker pocket somewhat lateral and   into the shoulder region JVP 7-8 cm; carotids brisk and full Clear to ausculation Status post bilateral breast implant regular rate and rhythm Regular rate and rhythm, no murmurs gallops   or rub Soft with active bowel sounds No clubbing cyanosis none Edema Alert and oriented, grossly normal motor and sensory function Skin Warm and Dry  Ventricularly paced with underlying sinus rhythm complete heart block  Assessment and  Plan  

## 2011-06-27 NOTE — Interval H&P Note (Signed)
History and Physical Interval Note:  06/27/2011 1:25 PM  Gabriela Phillips  has presented today for surgery, with the diagnosis of End of life  The various methods of treatment have been discussed with the patient and family. After consideration of risks, benefits and other options for treatment, the patient has consented to  Procedure(s): PACEMAKER GENERATOR CHANGE as a surgical intervention .  The patients' history has been reviewed, patient examined, no change in status, stable for surgery.  I have reviewed the patients' chart and labs.  Questions were answered to the patient's satisfaction.     Lewayne Bunting

## 2011-06-27 NOTE — Op Note (Signed)
DDD PPM insertion and removal with PM pocket revision without immediate complication. [id number].

## 2011-06-28 NOTE — Op Note (Signed)
NAME:  Gabriela Phillips, Gabriela Phillips              ACCOUNT NO.:  [id number]  MEDICAL RECORD NO.:  [id number]  LOCATION:  [facility]                         FACILITY:  [facility]  PHYSICIAN:  Doylene Canning. Ladona Ridgel, MD    DATE OF BIRTH:  July 23, 1940  DATE OF PROCEDURE:  06/27/2011 DATE OF DISCHARGE:  06/27/2011                              OPERATIVE REPORT   PROCEDURE PERFORMED:  Removal of a previously implanted dual-chamber pacemaker, which had reached elective replacement, and insertion of a new dual-chamber pacemaker with pacemaker pocket revision.  INTRODUCTION:  The patient is a 71 year old woman with complete heart block who underwent permanent pacemaker insertion in 2005.  She has reached elective replacement on her device and is now referred for permanent pacemaker generator change.  PROCEDURE:  After informed consent was obtained, the patient was taken to the diagnostic EP lab in a fasting state.  After usual preparation and draping, intravenous fentanyl and midazolam was given for sedation. Lidocaine 30 mL was infiltrated into the left infraclavicular region, and a 5 cm incision was carried out over this region, and electrocautery was utilized to dissect down to the pacemaker pocket.  Because the patient had complained of pain over her left shoulder when she slept on her left side, the pocket was revised more medially.  The old generator was removed and the atrial and ventricular leads were evaluated.  The R- waves were 8, the P-waves 2, the impedance was 400 in both the atrium and ventricle.  Thresholds were both less than a V at 0.5 msec.  At this point, the new Medtronic Sensia dual-chamber pacemaker, serial number [id number] was connected to the atrial and the ventricular leads and placed back in the subcutaneous pocket.  The pocket was irrigated with antibiotic irrigation, and the incision was closed with 2-0 and 3-0 Vicryl.  Benzoin and Steri-Strips were painted on the skin, pressure dressing was  placed, and the patient was returned to her room in satisfactory condition.  COMPLICATIONS:  There were no immediate procedure complications.  RESULTS:  Demonstrate successful pacemaker generator removal, followed by successful pacemaker pocket revision, followed by successful insertion of a new dual-chamber pacemaker in a patient with complete heart block.     Doylene Canning. Ladona Ridgel, MD     GWT/MEDQ  D:  06/27/2011  T:  06/28/2011  Job:  [id number]  cc:   Armanda Magic, M.D.

## 2011-07-01 ENCOUNTER — Telehealth: Payer: Self-pay | Admitting: Internal Medicine

## 2011-07-01 ENCOUNTER — Encounter: Payer: Self-pay | Admitting: Physician Assistant

## 2011-07-01 NOTE — Telephone Encounter (Signed)
I spoke with the patient's husband. He states that the patient has had some minimal swelling to her device site. She is s/p generator change on 06/27/11. She has very minimal bruising. He reports the patient is also taking motrin for pain because the tylenol was not working for her. I advised this may increase her risk of bleeding. I reviewed the above with Dr. Graciela Husbands. He stated if the site is tight and swollen, or the size of a plum, we need to look at it. Otherwise, she is probably ok. He states the patient should not use motrin. I have advised the patient's husband of Dr. Odessa Fleming recommendations. They feel like her swelling is minimal. I have advised that they should monitor this over the weekend and if it getting worse, they will call us first thing on Monday morning. I have also explained to them that they could make the edge of the swelling with a pen and see if it grows outside the pen mark. They verbalize understanding. The patient has a wound check scheduled for next Friday 07/08/11.

## 2011-07-01 NOTE — Telephone Encounter (Signed)
New Problem:     Patients husband called in because his wife had some minor swelling and had a few more post surgical questions. Please call back

## 2011-07-08 ENCOUNTER — Ambulatory Visit (INDEPENDENT_AMBULATORY_CARE_PROVIDER_SITE_OTHER): Payer: Medicare Other | Admitting: *Deleted

## 2011-07-08 ENCOUNTER — Encounter: Payer: Self-pay | Admitting: Internal Medicine

## 2011-07-08 ENCOUNTER — Ambulatory Visit: Payer: Medicare Other | Admitting: Physician Assistant

## 2011-07-08 DIAGNOSIS — I442 Atrioventricular block, complete: Secondary | ICD-10-CM

## 2011-07-08 LAB — PACEMAKER DEVICE OBSERVATION
AL AMPLITUDE: 2.8 mv
BAMS-0001: 155 {beats}/min
BATTERY VOLTAGE: 2.79 V
RV LEAD AMPLITUDE: 4 mv

## 2011-07-08 NOTE — Progress Notes (Signed)
Wound check-PPM 

## 2011-09-05 ENCOUNTER — Ambulatory Visit (INDEPENDENT_AMBULATORY_CARE_PROVIDER_SITE_OTHER): Payer: Medicare Other | Admitting: *Deleted

## 2011-09-05 DIAGNOSIS — I442 Atrioventricular block, complete: Secondary | ICD-10-CM

## 2011-09-05 DIAGNOSIS — R0989 Other specified symptoms and signs involving the circulatory and respiratory systems: Secondary | ICD-10-CM

## 2011-09-05 NOTE — Progress Notes (Signed)
Pt complaining of "whisker" at pacer site. Pt had stitch removed x 1 mth ago and at site has little scab. Pt is feeling scab. Pt also removed "hair" or part of stitch last week. Pt was informed if anything like again to call office. Do not remove anything from site. Pt agrees and understands to call office.

## 2013-03-25 ENCOUNTER — Telehealth: Payer: Self-pay | Admitting: Interventional Cardiology

## 2013-03-25 NOTE — Telephone Encounter (Signed)
Cp with exertion/mid chest, last experienced this morning, request to be seen. pt has not tried nitro/ she expressed fear with use but took 2 asa instead. Pt declined app with extender but was later convinced to be seen tomorrow with PA Alben Spittle and Dr Nechama Guard is in clinic for PA to refer to.  Pt was told to try nitro/ re instructed it's use to husband/ told to go to ED with further cp. Husband verbalized understanding.

## 2013-03-25 NOTE — Telephone Encounter (Signed)
Noted/ pt was advised to go to ED in earlier conversation/ declined.

## 2013-03-25 NOTE — Telephone Encounter (Signed)
If having significant CP, she may need to go to the ED. I would suggest reviewing with the DOD today. I need access to records from [facility] so that I can see this patient tomorrow. Tereso Newcomer, PA-C   03/25/2013 1:33 PM

## 2013-03-25 NOTE — Telephone Encounter (Signed)
New problem    C/O occasional  chest tightness .

## 2013-03-25 NOTE — Telephone Encounter (Signed)
New problem   Gabriela Phillips/[employer] please call concerning previous message.

## 2013-03-26 ENCOUNTER — Encounter: Payer: Self-pay | Admitting: Physician Assistant

## 2013-03-26 ENCOUNTER — Ambulatory Visit (INDEPENDENT_AMBULATORY_CARE_PROVIDER_SITE_OTHER): Payer: Medicare Other | Admitting: Physician Assistant

## 2013-03-26 VITALS — BP 128/70 | HR 79 | Ht 60.0 in | Wt 146.0 lb

## 2013-03-26 DIAGNOSIS — I10 Essential (primary) hypertension: Secondary | ICD-10-CM

## 2013-03-26 DIAGNOSIS — R0602 Shortness of breath: Secondary | ICD-10-CM

## 2013-03-26 DIAGNOSIS — I442 Atrioventricular block, complete: Secondary | ICD-10-CM

## 2013-03-26 DIAGNOSIS — E782 Mixed hyperlipidemia: Secondary | ICD-10-CM

## 2013-03-26 DIAGNOSIS — Z950 Presence of cardiac pacemaker: Secondary | ICD-10-CM

## 2013-03-26 DIAGNOSIS — I428 Other cardiomyopathies: Secondary | ICD-10-CM

## 2013-03-26 DIAGNOSIS — R079 Chest pain, unspecified: Secondary | ICD-10-CM

## 2013-03-26 MED ORDER — FAMOTIDINE 20 MG PO TABS: ORAL_TABLET | ORAL | Status: DC

## 2013-03-26 NOTE — Progress Notes (Signed)
[address] [place], [place]  [address] Phone: [phone] Fax:  [phone]  Date:  03/26/2013   ID:  Gabriela Phillips, DOB 1941/05/02, MRN [id number]  PCP:  Mickie Hillier, MD  Cardiologist:  Dr. Everette Rank   Electrophysiologist:  Dr. Sherryl Manges    History of Present Illness: Gabriela Phillips is a 72 y.o. female who returns for evaluation of CP.   She has a history of nonischemic cardiomyopathy, systolic CHF, complete heart block status post pacemaker implantation, HTN, HL, hypothyroidism, impaired fasting glucose, radiculopathy followed by neurology, hepatic steatosis. LHC (2/28): Severe HK at the apex, EF 40%, diffuse luminal irregularities, no significant CAD. Echocardiogram (2/08: EF 40-45%. Echo (03/2010): Normal LV function, EF 68.4%, apical AK, mild MR, mild TR, normal RV systolic pressure, diastolic dysfunction. Lexiscan Myoview (2/13): EF 76%, normal perfusion; low-risk study. Last seen by Dr. Eldridge Dace in 03/2012.  She has a hx of chronic CP.  Over the last 2 weeks, she has had a recurrence of exertional CP described as sharp.  She can exert herself at times and have no symptoms.  She does note DOE.  She is NYHA Class II.  No orthopnea, edema.  She has awoken SOB in the middle of the night (?PND).  She denies radiating symptoms or assoc nausea.  She does experience diaphoresis with her CP.  No syncope or palps.  She has not tried NTG.  Of note, she is under a great deal of stress.  She has also been eating a large amount of tomatoes and bananas in the last few weeks.    Labs (1/13):  K 4.5, Cr 1.4, Hgb 12.9   Wt Readings from Last 3 Encounters:  03/26/13 146 lb (66.225 kg)  06/27/11 150 lb (68.04 kg)  06/27/11 150 lb (68.04 kg)     Past Medical History  Diagnosis Date  . Complete heart block   . Hypothyroidism   . Hypertension   . Pacemaker 2005    Medtronic [id number] for third-degree AV block  . Radiculopathy     followed by Dr. Terrace Arabia  . CAD (coronary  artery disease)     a. LHC (2/28): Severe HK at the apex, EF 40%, diffuse luminal irregularities, no significant CAD.;  b.  Lexiscan Myoview (2/13): EF 76%, normal perfusion; low-risk study  . Chronic systolic CHF (congestive heart failure)     EF previously 40%; NICM; EF recovered;  Echo (03/2010): Normal LV function, EF 68.4%, apical AK, mild MR, mild TR, normal RV systolic pressure, diastolic dysfunction.  Marland Kitchen NICM (nonischemic cardiomyopathy)     non-ischemic; resolved EF 75%; GSPECT  . Pre-diabetes     Current Outpatient Prescriptions  Medication Sig Dispense Refill  . aspirin 81 MG tablet Take 160 mg by mouth daily.        Marland Kitchen atorvastatin (LIPITOR) 20 MG tablet Take 20 mg by mouth daily.        . carvedilol (COREG) 6.25 MG tablet Take 6.25 mg by mouth 2 (two) times daily with a meal.        . cyanocobalamin 500 MCG tablet Take 500 mcg by mouth daily.       . furosemide (LASIX) 40 MG tablet Take 60 mg by mouth.        . levothyroxine (SYNTHROID, LEVOTHROID) 50 MCG tablet Take 50 mcg by mouth daily.        Marland Kitchen LORazepam (ATIVAN) 1 MG tablet Take one tablet four times daily       .  nitroGLYCERIN (NITROSTAT) 0.4 MG SL tablet Place 0.4 mg under the tongue every 5 (five) minutes as needed. For chest pain      . Omega-3 Fatty Acids (RA FISH OIL) 900 MG CAPS Take 2 capsules by mouth daily.        . potassium chloride SA (K-DUR,KLOR-CON) 20 MEQ tablet Take 10 mEq by mouth 2 (two) times daily. 2omeq 1/2 po daily       No current facility-administered medications for this visit.    Allergies:    Allergies  Allergen Reactions  . Penicillins Other (See Comments)    Dark stool    Social History:  The patient  reports that she quit smoking about 15 years ago. She has never used smokeless tobacco. She reports that she does not drink alcohol or use illicit drugs.   Family History:  The patient's family history is not on file.   ROS:  Please see the history of present illness.   She denies  pleuritic CP or CP with lying supine.  No dysphagia.  No hematemesis.  No vomiting or diarrhea.  No melena.  No CP related to meals.    All other systems reviewed and negative.   PHYSICAL EXAM: VS:  BP 128/70  Pulse 79  Ht 5' (1.524 m)  Wt 146 lb (66.225 kg)  BMI 28.51 kg/m2 Well nourished, well developed, in no acute distress HEENT: normal Neck: no JVD Vascular:  No carotid bruits Cardiac:  normal S1, S2; RRR; no murmur Lungs:  clear to auscultation bilaterally, no wheezing, rhonchi or rales Abd: soft, nontender, no hepatomegaly Ext: no edema Skin: warm and dry Neuro:  CNs 2-12 intact, no focal abnormalities noted  EKG:  AV paced, HR 79     ASSESSMENT AND PLAN:  1. Chest Pain:  She has typical and atypical features.  She had a fairly normal LHC in 2008 and a low risk myoview in 07/2011.  She has significant risk factors for CAD.  I will arrange a Lexiscan Myoview.  I will also put her on Pepcid 20 mg BID to see if this helps. 2. Dyspnea:  She does not look volume overloaded. I will check a BMET, CBC, BNP today.  If BNP normal, continue current dose of lasix. 3. Non-Ischemic CM:  Continue beta blocker, Lasix.  Last assessment confirmed normalization of EF. 4. Hypertension:  Controlled.  Continue current therapy.  5. Hyperlipidemia:  Continue statin. 6. CHB, s/p Pacer:  F/u with EP as planned. 7. Disposition:  Keep f/u with Dr. Everette Rank in 2 weeks as planned.   Signed, Tereso Newcomer, PA-C  03/26/2013 3:20 PM

## 2013-03-26 NOTE — Patient Instructions (Signed)
START PEPCID 20 MG 1 TAB TWICE DAILY FOR 3-4 WEEKS; THEN TAKE TWICE DAILY ONLY AS NEEDED   Your physician has requested that you have a lexiscan myoview. For further information please visit [email or web address] Please follow instruction sheet, as given.  Your physician recommends that you return for lab work in: TODAY BMET, CBC W/DIFF, BNP  MAKE SURE TO KEEP YOUR APPT WITH DR. [name]

## 2013-03-27 ENCOUNTER — Ambulatory Visit (HOSPITAL_COMMUNITY): Payer: Medicare Other | Attending: Cardiology | Admitting: Radiology

## 2013-03-27 VITALS — BP 118/68 | Ht 60.0 in | Wt 145.0 lb

## 2013-03-27 DIAGNOSIS — R0989 Other specified symptoms and signs involving the circulatory and respiratory systems: Secondary | ICD-10-CM | POA: Insufficient documentation

## 2013-03-27 DIAGNOSIS — R0602 Shortness of breath: Secondary | ICD-10-CM

## 2013-03-27 DIAGNOSIS — E785 Hyperlipidemia, unspecified: Secondary | ICD-10-CM | POA: Insufficient documentation

## 2013-03-27 DIAGNOSIS — Z87891 Personal history of nicotine dependence: Secondary | ICD-10-CM | POA: Insufficient documentation

## 2013-03-27 DIAGNOSIS — R079 Chest pain, unspecified: Secondary | ICD-10-CM

## 2013-03-27 DIAGNOSIS — R0609 Other forms of dyspnea: Secondary | ICD-10-CM | POA: Insufficient documentation

## 2013-03-27 DIAGNOSIS — R61 Generalized hyperhidrosis: Secondary | ICD-10-CM | POA: Insufficient documentation

## 2013-03-27 DIAGNOSIS — I10 Essential (primary) hypertension: Secondary | ICD-10-CM | POA: Insufficient documentation

## 2013-03-27 LAB — BASIC METABOLIC PANEL
BUN: 34 mg/dL — ABNORMAL HIGH (ref 6–23)
Chloride: 103 mEq/L (ref 96–112)
Creatinine, Ser: 1.4 mg/dL — ABNORMAL HIGH (ref 0.4–1.2)

## 2013-03-27 LAB — CBC WITH DIFFERENTIAL/PLATELET
Basophils Absolute: 0 10*3/uL (ref 0.0–0.1)
Eosinophils Absolute: 0.4 10*3/uL (ref 0.0–0.7)
Eosinophils Relative: 6.4 % — ABNORMAL HIGH (ref 0.0–5.0)
MCHC: 32.2 g/dL (ref 30.0–36.0)
MCV: 96.9 fl (ref 78.0–100.0)
Monocytes Absolute: 0.4 10*3/uL (ref 0.1–1.0)
Neutrophils Relative %: 50.1 % (ref 43.0–77.0)
Platelets: 163 10*3/uL (ref 150.0–400.0)
WBC: 5.9 10*3/uL (ref 4.5–10.5)

## 2013-03-27 LAB — BRAIN NATRIURETIC PEPTIDE: Pro B Natriuretic peptide (BNP): 29 pg/mL (ref 0.0–100.0)

## 2013-03-27 MED ORDER — TECHNETIUM TC 99M SESTAMIBI GENERIC - CARDIOLITE
11.0000 | Freq: Once | INTRAVENOUS | Status: AC | PRN
  Administered 2013-03-27: 11 via INTRAVENOUS

## 2013-03-27 MED ORDER — TECHNETIUM TC 99M SESTAMIBI GENERIC - CARDIOLITE
33.0000 | Freq: Once | INTRAVENOUS | Status: AC | PRN
  Administered 2013-03-27: 33 via INTRAVENOUS

## 2013-03-27 MED ORDER — ADENOSINE (DIAGNOSTIC) 3 MG/ML IV SOLN
0.5600 mg/kg | Freq: Once | INTRAVENOUS | Status: AC
  Administered 2013-03-27: 36.9 mg via INTRAVENOUS

## 2013-03-27 NOTE — Progress Notes (Signed)
[facility] SITE 3 NUCLEAR MED [address] [place], [place] [address] [phone]    Cardiology Nuclear Med Study  Gabriela Phillips is a 72 y.o. female     MRN : [id number]     DOB: 01/20/1941  Procedure Date: 03/27/2013  Nuclear Med Background Indication for Stress Test:  Evaluation for Ischemia History:  '05, 2013 PTVP: CHB/Syncope; 07-2006 Cath: Nonobstructive CAD, EF= 40%;  2011 Echo: EF=68%; and 07-2011 Myocardial Perfusion Study-negative, EF=76% Cardiac Risk Factors: History of Smoking, Hypertension and Lipids  Symptoms:  Chest Pain, Diaphoresis, DOE and SOB   Nuclear Pre-Procedure Caffeine/Decaff Intake:  None > 12 hrs NPO After: 6:00pm   Lungs:  clear O2 Sat: 98% on room air. IV 0.9% NS with Angio Cath:  24g  IV Site: R Hand, tolerated well IV Started by:  Irean Hong, RN  Chest Size (in):  38 Cup Size: D  Height: 5' (1.524 m)  Weight:  145 lb (65.772 kg)  BMI:  Body mass index is 28.32 kg/(m^2). Tech Comments:  Carvedilol last night    Nuclear Med Study 1 or 2 day study: 1 day  Stress Test Type:  Adenosine  Reading MD: Lance Muss, MD  Order Authorizing Provider:  Lance Muss, MD, and Tereso Newcomer, [facility]  Resting Radionuclide: Technetium 56m Sestamibi  Resting Radionuclide Dose: 11.0 mCi   Stress Radionuclide:  Technetium 82m Sestamibi  Stress Radionuclide Dose: 33.0 mCi           Stress Protocol Rest HR: 69 Stress HR: 78  Rest BP: 118/68 Stress BP: 133/60  Exercise Time (min): n/a METS: n/a   Predicted Max HR: 149 bpm % Max HR: 52.35 bpm Rate Pressure Product: 21308   Dose of Adenosine (mg):  36.9 Dose of Lexiscan: n/a mg  Dose of Atropine (mg): n/a Dose of Dobutamine: n/a mcg/kg/min (at max HR)  Stress Test Technologist: Bonnita Levan, RN  Nuclear Technologist:  Harlow Asa, CNMT     Rest Procedure:  Myocardial perfusion imaging was performed at rest 45 minutes following the intravenous administration of Technetium 79m  Sestamibi. Rest ECG: NSR-LBBB due to ventricular pacing  Stress Procedure:  The patient received IV adenosine at 140 mcg/kg/min for 4-minutes with concurrent submaximal exercise (1.102mph, 0% grade).  Technetium 23m Sestamibi was injected at the 2-minute mark and quantitative spect images were obtained. Stress ECG: Uninteretable due to baseline LBBB  QPS Raw Data Images:  There is a breast shadow that accounts for the anterior attenuation. Stress Images:  There is decreased uptake in the anterior wall. There is decreased uptake in the apex. Rest Images:  There is decreased uptake in the anterior wall. There is decreased uptake in the apex. Subtraction (SDS):  No evidence of ischemia. Fixed anterior defect as noted above. Transient Ischemic Dilatation (Normal <1.22):  n/a Lung/Heart Ratio (Normal <0.45):  0.36  Quantitative Gated Spect Images QGS EDV:  62 ml QGS ESV:  16 ml  Impression Exercise Capacity:  Lexiscan study. BP Response:  Normal blood pressure response. Clinical Symptoms:  Atypical chest pain. ECG Impression:  Baseline:  LBBB.  EKG uninterpretable due to LBBB at rest and stress. Comparison with Prior Nuclear Study: No images to compare  Overall Impression:  Low risk stress nuclear study .No clear evidence of ischemia..  LV Ejection Fraction: 75%.  LV Wall Motion:  NL LV Function; NL Wall Motion.  [name],[name]

## 2013-03-28 ENCOUNTER — Telehealth: Payer: Self-pay | Admitting: *Deleted

## 2013-03-28 ENCOUNTER — Encounter: Payer: Self-pay | Admitting: Physician Assistant

## 2013-03-28 DIAGNOSIS — I429 Cardiomyopathy, unspecified: Secondary | ICD-10-CM

## 2013-03-28 DIAGNOSIS — I442 Atrioventricular block, complete: Secondary | ICD-10-CM

## 2013-03-28 MED ORDER — FUROSEMIDE 40 MG PO TABS: 40.0000 mg | ORAL_TABLET | Freq: Every day | ORAL | Status: DC

## 2013-03-28 MED ORDER — POTASSIUM CHLORIDE CRYS ER 20 MEQ PO TBCR: 10.0000 meq | EXTENDED_RELEASE_TABLET | Freq: Every day | ORAL | Status: DC

## 2013-03-28 NOTE — Telephone Encounter (Signed)
s/w both pt and pt's husband about all test results, verbal understanding on normal myoview. Pt states she is taking lasix 80 mg daily, K+ 10 meq bid. Pt states she is gaining 7-8 #'s by 6 pm QPM. She states she does not take her lasix until 6 pm and then "pees" it all out until about 10 pm. I said that is too much weight gain daily and is she watching her Na in her diet. Pt said yes, no can foods, does fresh foods daily however; she does state she has 2 soft drinks daily but they are sugar free. The husband states soft drinks are only 1% of DV and he does not believe these 2 soft drinks are doing this to her. Multiple times I asked for the husband to look and see what the Na mg content was on the can; he states 35 mg Na per serving. I explained that she is then taking in at least 75 mg Na daily and with having cardiomyopathy that is too much and I advised to at least cut back to one  If not cut it all the way out. I asked how much water does she drink and the husband said she does not drink any water. I stated to pt's husband before we do make the medication changes as stated by the PA that I need to s/w him 1st to see about recommendations based on this info today. Husband also said why is she still having chest discomfort, and I explained that she may have GERD and that is why Lorin Picket W. PA gave pt Pepcid. Husband asked if Prilosec was stronger than pepcid and I said not sure but may be a little, so the husband states to me that he is going to give some of his prilosec to pt. I said no not to do that and lets wait and see what the dr says at the 04/09/13 appt. Husband finally gave said ok. I stated I will try to cb before I leave today after I d/w Scott W. PA.

## 2013-03-28 NOTE — Telephone Encounter (Signed)
took lab message to DOD Dr. Ladona Ridgel. He advised lets still have lasix decreased to 40 mg daily, K+ 10 meq daily , bmet 1 week, increase water intake and d/c soft drinks. pt wants to do lab on 10/21 at her OV w/Dr. Eldridge Dace. I said thought that was fine.

## 2013-04-01 ENCOUNTER — Ambulatory Visit (INDEPENDENT_AMBULATORY_CARE_PROVIDER_SITE_OTHER): Payer: Medicare Other | Admitting: *Deleted

## 2013-04-01 DIAGNOSIS — I442 Atrioventricular block, complete: Secondary | ICD-10-CM

## 2013-04-01 DIAGNOSIS — Z950 Presence of cardiac pacemaker: Secondary | ICD-10-CM

## 2013-04-02 LAB — REMOTE PACEMAKER DEVICE
AL AMPLITUDE: 2.8 mv
AL IMPEDENCE PM: 549 Ohm
AL THRESHOLD: 1 V
BATTERY VOLTAGE: 2.79 V
RV LEAD IMPEDENCE PM: 589 Ohm

## 2013-04-09 ENCOUNTER — Encounter: Payer: Self-pay | Admitting: Interventional Cardiology

## 2013-04-09 ENCOUNTER — Other Ambulatory Visit (INDEPENDENT_AMBULATORY_CARE_PROVIDER_SITE_OTHER): Payer: Medicare Other

## 2013-04-09 ENCOUNTER — Ambulatory Visit (INDEPENDENT_AMBULATORY_CARE_PROVIDER_SITE_OTHER): Payer: Medicare Other | Admitting: Interventional Cardiology

## 2013-04-09 VITALS — BP 110/58 | HR 64 | Ht 60.0 in | Wt 144.8 lb

## 2013-04-09 DIAGNOSIS — I429 Cardiomyopathy, unspecified: Secondary | ICD-10-CM

## 2013-04-09 DIAGNOSIS — R079 Chest pain, unspecified: Secondary | ICD-10-CM | POA: Insufficient documentation

## 2013-04-09 DIAGNOSIS — Z950 Presence of cardiac pacemaker: Secondary | ICD-10-CM

## 2013-04-09 DIAGNOSIS — I10 Essential (primary) hypertension: Secondary | ICD-10-CM | POA: Insufficient documentation

## 2013-04-09 DIAGNOSIS — E782 Mixed hyperlipidemia: Secondary | ICD-10-CM

## 2013-04-09 DIAGNOSIS — I428 Other cardiomyopathies: Secondary | ICD-10-CM

## 2013-04-09 DIAGNOSIS — R609 Edema, unspecified: Secondary | ICD-10-CM | POA: Insufficient documentation

## 2013-04-09 LAB — BASIC METABOLIC PANEL WITH GFR
BUN: 23 mg/dL (ref 6–23)
CO2: 30 meq/L (ref 19–32)
Calcium: 9.4 mg/dL (ref 8.4–10.5)
Chloride: 103 meq/L (ref 96–112)
Creatinine, Ser: 1.2 mg/dL (ref 0.4–1.2)
GFR: 45.25 mL/min — ABNORMAL LOW
Glucose, Bld: 123 mg/dL — ABNORMAL HIGH (ref 70–99)
Potassium: 4.7 meq/L (ref 3.5–5.1)
Sodium: 139 meq/L (ref 135–145)

## 2013-04-09 MED ORDER — FUROSEMIDE 40 MG PO TABS: ORAL_TABLET | ORAL | Status: DC

## 2013-04-09 MED ORDER — LANSOPRAZOLE 30 MG PO CPDR: 30.0000 mg | DELAYED_RELEASE_CAPSULE | Freq: Every day | ORAL | Status: DC

## 2013-04-09 NOTE — Patient Instructions (Signed)
Your physician wants you to follow-up in: 1 year with Dr. Eldridge Dace. You will receive a reminder letter in the mail two months in advance. If you don't receive a letter, please call our office to schedule the follow-up appointment.  Your prescriptions for prevacid and lasix were sent into [facility] for 90 day supply's with refills.  Your physician recommends that you continue on your current medications as directed. Please refer to the Current Medication list given to you today.  Your physician recommends that you return for lab work today for Bmet.

## 2013-04-09 NOTE — Progress Notes (Signed)
Patient ID: Gabriela Phillips, female   DOB: 1940/08/03, 72 y.o.   MRN: [id number]    [address] [place], [place]  [address] Phone: [phone] Fax:  [phone]  Date:  04/09/2013   ID:  Gabriela, Phillips September 18, 1940, MRN [id number]  PCP:  Gabriela Hillier, MD      History of Present Illness: Gabriela Phillips is a 72 y.o. female who has had a pacer placed and a Landscape architect. SHe has nonobstructive CAD by cath.  SHe had some recent chest pain and then a negative stress test a few weeks ago.  She does not exercise much.   She has had swelling if she sits for a while.  She feels better on increased lasix, but has not started exercising.  Hypertension:  c/o Leg edema more at the end of the day.  Denies : Dizziness.  Orthopnea.  Paroxysmal nocturnal dyspnea.  Palpitations.     Wt Readings from Last 3 Encounters:  04/09/13 144 lb 12.8 oz (65.681 kg)  03/27/13 145 lb (65.772 kg)  03/26/13 146 lb (66.225 kg)     Past Medical History  Diagnosis Date  . Complete heart block   . Hypothyroidism   . Hypertension   . Pacemaker 2005    Medtronic [id number] for third-degree AV block  . Radiculopathy     followed by Dr. Terrace Arabia  . CAD (coronary artery disease)     a. LHC (2/28): Severe HK at the apex, EF 40%, diffuse luminal irregularities, no significant CAD.;  b.  Lexiscan Myoview (2/13): EF 76%, normal perfusion; low-risk study;  c.  Lexiscan Myoview (10/14):  Low risk, no ischemia, EF 75%.  . Chronic systolic CHF (congestive heart failure)     EF previously 40%; NICM; EF recovered;  Echo (03/2010): Normal LV function, EF 68.4%, apical AK, mild MR, mild TR, normal RV systolic pressure, diastolic dysfunction.  Marland Kitchen NICM (nonischemic cardiomyopathy)     non-ischemic; resolved EF 75%; GSPECT  . Pre-diabetes     Current Outpatient Prescriptions  Medication Sig Dispense Refill  . aspirin 81 MG tablet Take 160 mg by mouth daily.        Marland Kitchen atorvastatin (LIPITOR) 20 MG tablet  Take 20 mg by mouth daily.        . carvedilol (COREG) 6.25 MG tablet Take 6.25 mg by mouth 2 (two) times daily with a meal.        . cyanocobalamin 500 MCG tablet Take 500 mcg by mouth as needed.       . famotidine (PEPCID) 20 MG tablet 1 tab twice daily for 3-4 weeks then take twice daily only as needed  60 tablet  1  . furosemide (LASIX) 40 MG tablet Take 1 tablet (40 mg total) by mouth daily.      Marland Kitchen levothyroxine (SYNTHROID, LEVOTHROID) 50 MCG tablet Take 50 mcg by mouth daily.        Marland Kitchen LORazepam (ATIVAN) 1 MG tablet Take one tablet four times daily       . losartan (COZAAR) 25 MG tablet Take 25 mg by mouth daily.      . nitroGLYCERIN (NITROSTAT) 0.4 MG SL tablet Place 0.4 mg under the tongue every 5 (five) minutes as needed. For chest pain      . Omega-3 Fatty Acids (RA FISH OIL) 900 MG CAPS Take 4 capsules by mouth daily.       . potassium chloride SA (K-DUR,KLOR-CON) 20 MEQ tablet Take 0.5  tablets (10 mEq total) by mouth daily.       No current facility-administered medications for this visit.    Allergies:    Allergies  Allergen Reactions  . Penicillins Other (See Comments)    Dark stool    Social History:  The patient  reports that she quit smoking about 15 years ago. She has never used smokeless tobacco. She reports that she does not drink alcohol or use illicit drugs.   Family History:  The patient's family history is not on file.   ROS:  Please see the history of present illness.  No nausea, vomiting.  No fevers, chills.  No focal weakness.  No dysuria.    All other systems reviewed and negative.   PHYSICAL EXAM: VS:  BP 110/58  Pulse 64  Ht 5' (1.524 m)  Wt 144 lb 12.8 oz (65.681 kg)  BMI 28.28 kg/m2 Well nourished, well developed, in no acute distress HEENT: normal Neck: no JVD, no carotid bruits Cardiac:  normal S1, S2; RRR;  Lungs:  clear to auscultation bilaterally, no wheezing, rhonchi or rales Abd: soft, nontender, no hepatomegaly Ext: no edema Skin: warm  and dry Neuro:   no focal abnormalities noted       ASSESSMENT AND PLAN:  Chest pain  Rare, atypical features. Cardiolite was negative in 10 /14  2. Essential hypertension, benign  Continue Losartan Potassium Tablet, 25 MG, 1 tablet, Orally, once every morning Refill Furosemide Tablet, 40 mg,  daily  LAB: Basic Metabolic (Ordered for 05/04/2012) BP well controlled.  3. Cardiac pacemaker  Pacer checks to be arranged.  4. Mixed hyperlipidemia  Continue Fish Oil Capsule, 900 MG, 2 capsules, Orally, twice a day Continue Atorvastatin Calcium Tablet, 40 mg, TAKE ONE TABLET BY MOUTH EVERY DAY Continue fish oil. LDL 59. LDL 47 in 2014. 5. Fluid retention  she would like to continue titrating her dosage of Lasix.  She'll confirm and let us know. I did warn her about the consequences of taking too much Lasix.  We'll check bmet.  Signed, Fredric Mare, MD, [facility] 04/09/2013 1:47 PM

## 2013-04-10 ENCOUNTER — Encounter: Payer: Self-pay | Admitting: *Deleted

## 2013-04-26 ENCOUNTER — Encounter: Payer: Self-pay | Admitting: Internal Medicine

## 2013-06-28 ENCOUNTER — Encounter: Payer: Self-pay | Admitting: Internal Medicine

## 2013-06-28 ENCOUNTER — Ambulatory Visit (INDEPENDENT_AMBULATORY_CARE_PROVIDER_SITE_OTHER): Payer: Medicare HMO | Admitting: Internal Medicine

## 2013-06-28 ENCOUNTER — Encounter (INDEPENDENT_AMBULATORY_CARE_PROVIDER_SITE_OTHER): Payer: Self-pay

## 2013-06-28 VITALS — BP 132/70 | HR 88 | Ht 60.0 in | Wt 150.0 lb

## 2013-06-28 DIAGNOSIS — E782 Mixed hyperlipidemia: Secondary | ICD-10-CM

## 2013-06-28 DIAGNOSIS — I442 Atrioventricular block, complete: Secondary | ICD-10-CM

## 2013-06-28 DIAGNOSIS — I10 Essential (primary) hypertension: Secondary | ICD-10-CM

## 2013-06-28 DIAGNOSIS — Z950 Presence of cardiac pacemaker: Secondary | ICD-10-CM

## 2013-06-28 LAB — MDC_IDC_ENUM_SESS_TYPE_INCLINIC
Battery Impedance: 175 Ohm
Brady Statistic AS VS Percent: 0 %
Lead Channel Impedance Value: 501 Ohm
Lead Channel Impedance Value: 566 Ohm
Lead Channel Pacing Threshold Amplitude: 1 V
Lead Channel Pacing Threshold Pulse Width: 0.4 ms
Lead Channel Setting Pacing Amplitude: 2 V
Lead Channel Setting Pacing Amplitude: 2.5 V
Lead Channel Setting Sensing Sensitivity: 2 mV
MDC IDC MSMT BATTERY REMAINING LONGEVITY: 105 mo
MDC IDC MSMT BATTERY VOLTAGE: 2.78 V
MDC IDC MSMT LEADCHNL RA PACING THRESHOLD AMPLITUDE: 1 V
MDC IDC MSMT LEADCHNL RA PACING THRESHOLD PULSEWIDTH: 0.4 ms
MDC IDC MSMT LEADCHNL RA SENSING INTR AMPL: 2.8 mV
MDC IDC MSMT LEADCHNL RV SENSING INTR AMPL: 4 mV
MDC IDC SESS DTM: 20150109154036
MDC IDC SET LEADCHNL RV PACING PULSEWIDTH: 0.4 ms
MDC IDC STAT BRADY AP VP PERCENT: 36 %
MDC IDC STAT BRADY AP VS PERCENT: 0 %
MDC IDC STAT BRADY AS VP PERCENT: 64 %

## 2013-06-28 NOTE — Progress Notes (Signed)
HPI  [name] returns today for followup. She is a very pleasant 73 year old woman with a history of complete heart block, hypertension, status post permanent pacemaker insertion. In the interim, she has done well. She has minimal tenderness around her pacemaker insertion site. She has class II heart failure symptoms. The patient has a prior history of left ventricular dysfunction, with normalization of her ejection fraction in the past. She denies syncope. Allergies  Allergen Reactions  . Penicillins Other (See Comments)    Dark stool     Current Outpatient Prescriptions  Medication Sig Dispense Refill  . aspirin 81 MG tablet Take 160 mg by mouth daily.        Marland Kitchen atorvastatin (LIPITOR) 20 MG tablet Take 20 mg by mouth daily.        . carvedilol (COREG) 6.25 MG tablet Take 6.25 mg by mouth 2 (two) times daily with a meal.        . cyanocobalamin 500 MCG tablet Take 500 mcg by mouth as needed.       . famotidine (PEPCID) 20 MG tablet 1 tab twice daily for 3-4 weeks then take twice daily only as needed  60 tablet  1  . furosemide (LASIX) 40 MG tablet 1-2 tablets prn for swelling  180 tablet  3  . lansoprazole (PREVACID) 30 MG capsule Take 1 capsule (30 mg total) by mouth daily.  90 capsule  3  . levothyroxine (SYNTHROID, LEVOTHROID) 50 MCG tablet Take 50 mcg by mouth daily.        Marland Kitchen LORazepam (ATIVAN) 1 MG tablet Take one tablet four times daily       . losartan (COZAAR) 25 MG tablet Take 25 mg by mouth daily.      . nitroGLYCERIN (NITROSTAT) 0.4 MG SL tablet Place 0.4 mg under the tongue every 5 (five) minutes as needed. For chest pain      . Omega-3 Fatty Acids (RA FISH OIL) 900 MG CAPS Take 4 capsules by mouth daily.       . potassium chloride SA (K-DUR,KLOR-CON) 20 MEQ tablet Take 0.5 tablets (10 mEq total) by mouth daily.       No current facility-administered medications for this visit.     Past Medical History  Diagnosis Date  . Complete heart block   .  Hypothyroidism   . Hypertension   . 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 Radiculopathy     followed by Dr. Krista Blue  . CAD (coronary artery disease)     a. LHC (2/28): Severe HK at the apex, EF 40%, diffuse luminal irregularities, no significant CAD.;  b.  Lexiscan Myoview (2/13): EF 76%, normal perfusion; low-risk study;  c.  Lexiscan Myoview (10/14):  Low risk, no ischemia, EF 75%.  . Chronic systolic CHF (congestive heart failure)     EF previously 40%; NICM; EF recovered;  Echo (03/2010): Normal LV function, EF 68.4%, apical AK, mild MR, mild TR, normal RV systolic pressure, diastolic dysfunction.  Marland Kitchen NICM (nonischemic cardiomyopathy)     non-ischemic; resolved EF 75%; GSPECT  . Pre-diabetes     ROS:   All systems reviewed and negative except as noted in the HPI.   Past Surgical History  Procedure Laterality Date  . Pacemaker insertion  2005    complete heart block  . Breast enhancement surgery    . Doppler echocardiography  03/2010    with normal left ventricular function, ejection fraction  68%, apical akinesis and mild mitral regurgitation     Family History  Problem Relation Age of Onset  . Diabetes Mother   . Liver cancer Father      History   Social History  . Marital Status: Married    Spouse Name: N/A    Number of Children: N/A  . Years of Education: N/A   Occupational History  . Not on file.   Social History Main Topics  . Smoking status: Former Smoker    Quit date: 06/23/1997  . Smokeless tobacco: Never Used  . Alcohol Use: No  . Drug Use: No  . Sexual Activity: Not on file   Other Topics Concern  . Not on file   Social History Narrative  . No narrative on file     BP 132/70  Pulse 88  Ht 5' (1.524 m)  Wt 150 lb (68.04 kg)  BMI 29.30 kg/m2  Physical Exam:  Well appearing NAD HEENT: Unremarkable Neck:  No JVD, no thyromegally Back:  No CVA tenderness Lungs:  Clear with no wheezes, rales, or  rhonchi.Well-healed pacemaker insertion site. HEART:  Regular rate rhythm, no murmurs, no rubs, no clicks Abd:  soft, positive bowel sounds, no organomegally, no rebound, no guarding Ext:  2 plus pulses, no edema, no cyanosis, no clubbing Skin:  No rashes no nodules Neuro:  CN II through XII intact, motor grossly intact  DEVICE  Normal device function.  See PaceArt for details.   Assess/Plan:

## 2013-06-28 NOTE — Assessment & Plan Note (Signed)
She is instructed and maintain a low-sodium diet. She'll continue her current medical therapy.

## 2013-06-28 NOTE — Assessment & Plan Note (Signed)
She is instructed to maintain a low-fat diet. She will continue exercising.

## 2013-06-28 NOTE — Assessment & Plan Note (Signed)
Her Medtronic dual-chamber pacemaker is working normally. We'll plan to recheck in several months. 

## 2013-06-28 NOTE — Patient Instructions (Signed)
Remote monitoring is used to monitor your Pacemaker or ICD from home. This monitoring reduces the number of office visits required to check your device to one time per year. It allows [place] to keep an eye on the functioning of your device to ensure it is working properly. You are scheduled for a device check from home on 09-30-13. You may send your transmission at any time that day. If you have a wireless device, the transmission will be sent automatically. After your physician reviews your transmission, you will receive a postcard with your next transmission date.   Your physician wants you to follow-up in: [place] will receive a reminder letter in the mail two months in advance. If you don't receive a letter, please call our office to schedule the follow-up appointment.

## 2013-07-03 IMAGING — CT CT MAXILLOFACIAL W/O CM
4 of 6 series · 16 of 30 positions shown, 19 images · non-contrast
Comparison: None

CLINICAL DATA: Fever and facial pain.

CT MAXILLOFACIAL WITHOUT CONTRAST
TECHNIQUE: Multidetector CT imaging of the maxillofacial
structures was performed. Multiplanar CT image reconstructions were
also generated.

[Series 4: recon 3: supine facial bones · axial · 0.36mm/px · z∈[+16,+149]mm · 10 of 260 slices shown, 13 images]
[im 24/260  brain]
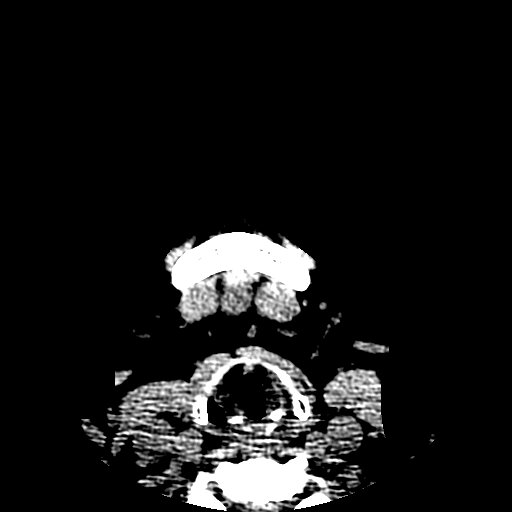
[im 24/260  bone]
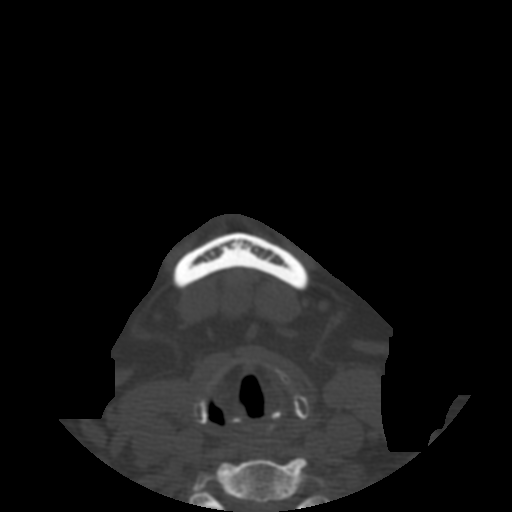
[im 48/260  bone]
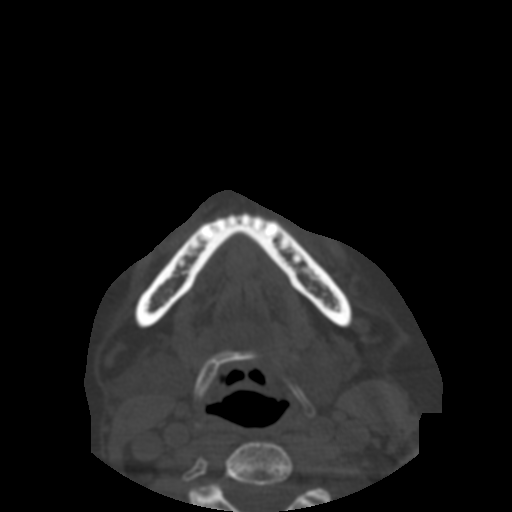
[im 71/260  bone]
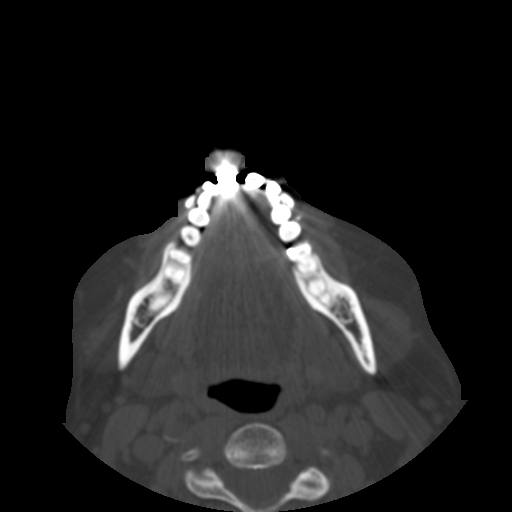
[im 95/260  bone]
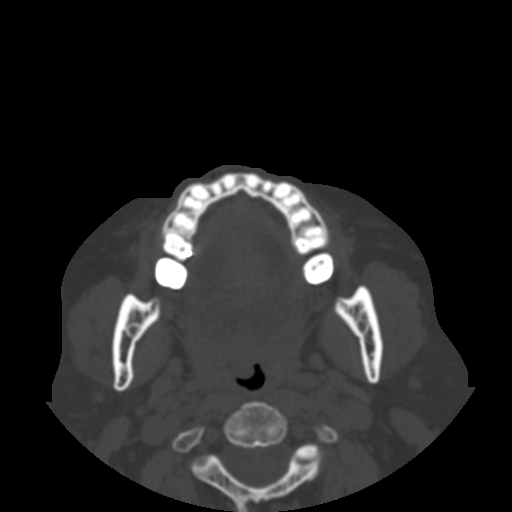
[im 118/260  brain]
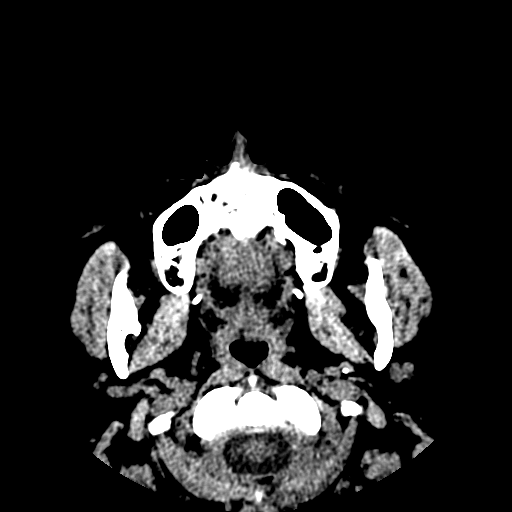
[im 118/260  bone]
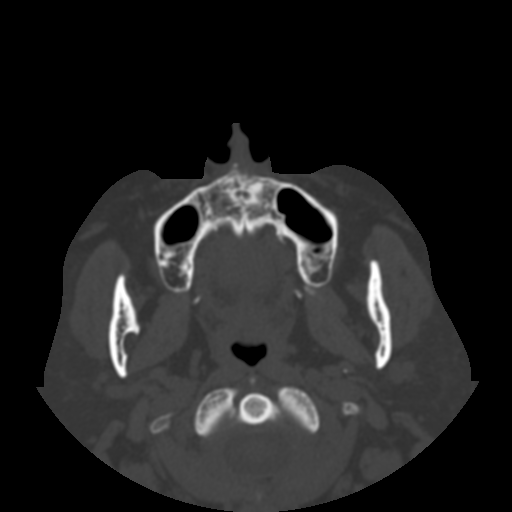
[im 142/260  bone]
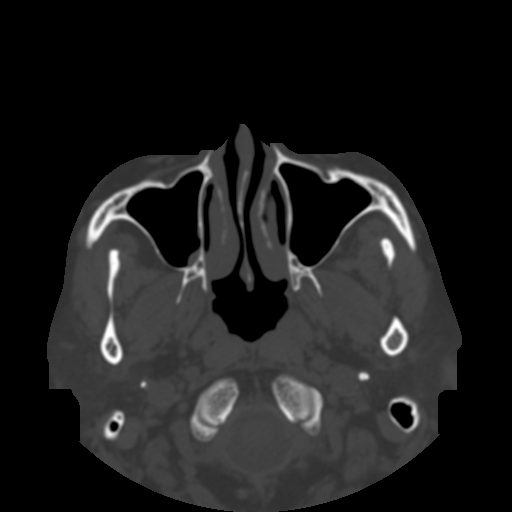
[im 165/260  bone]
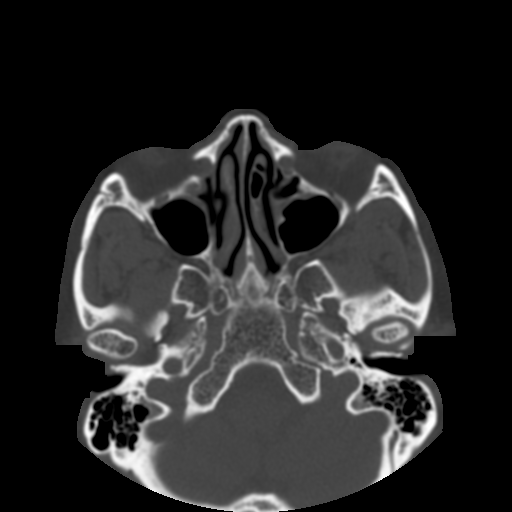
[im 189/260  bone]
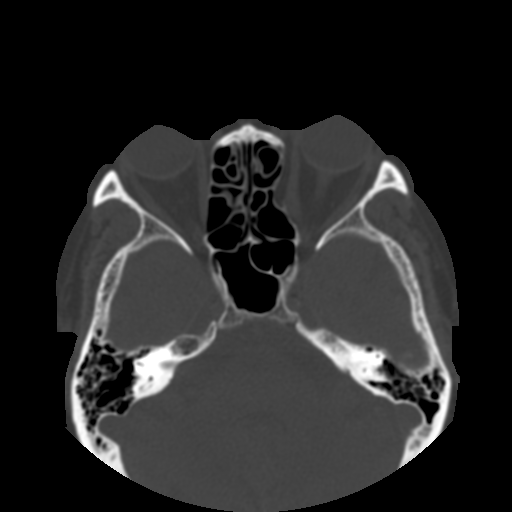
[im 212/260  brain]
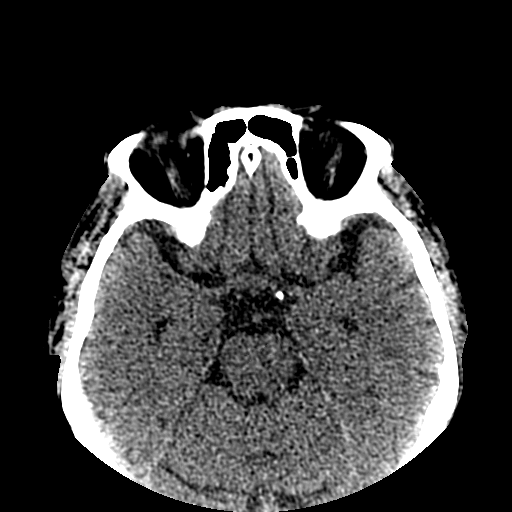
[im 212/260  bone]
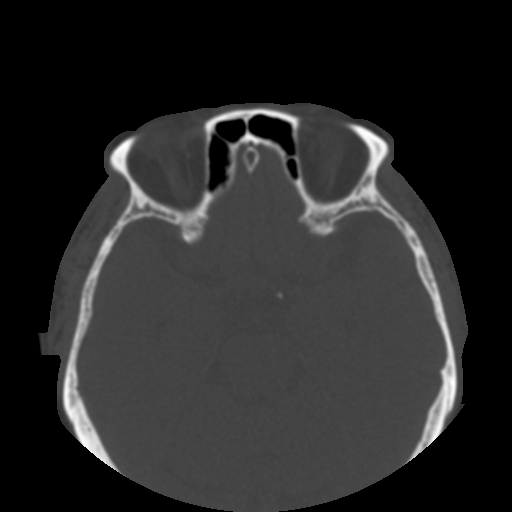
[im 236/260  bone]
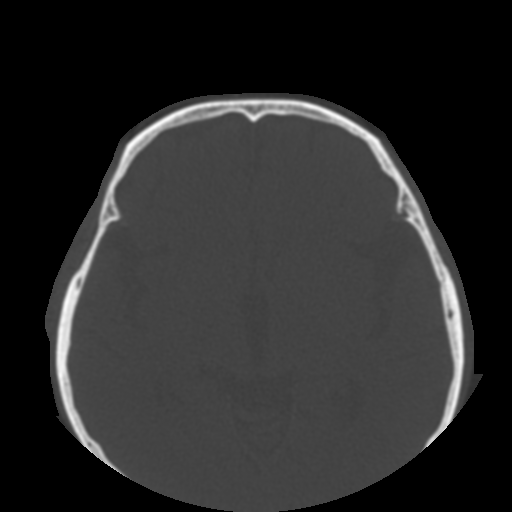

[Series 401: sag · sagittal · 0.36mm/px · 2 of 83 slices shown (1 of 2)]
[im 28/83  bone]
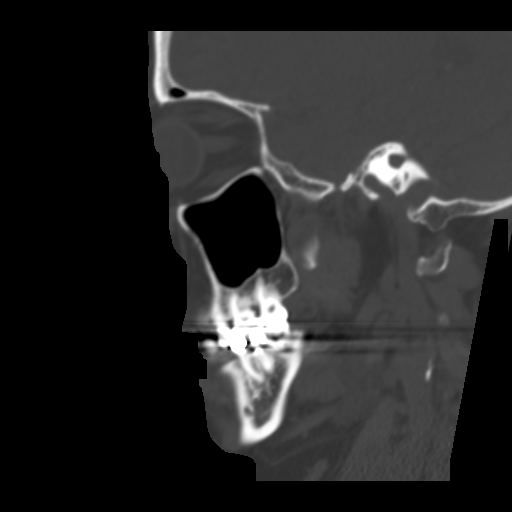
[im 55/83  bone]
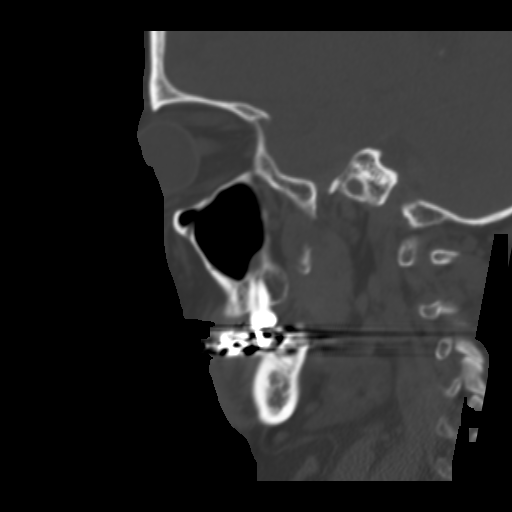

[Series 402: cor · coronal · 0.36mm/px · 2 of 77 slices shown]
[im 26/77  bone]
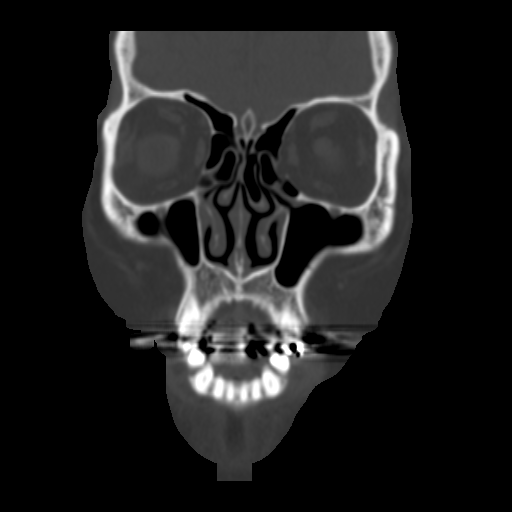
[im 51/77  bone]
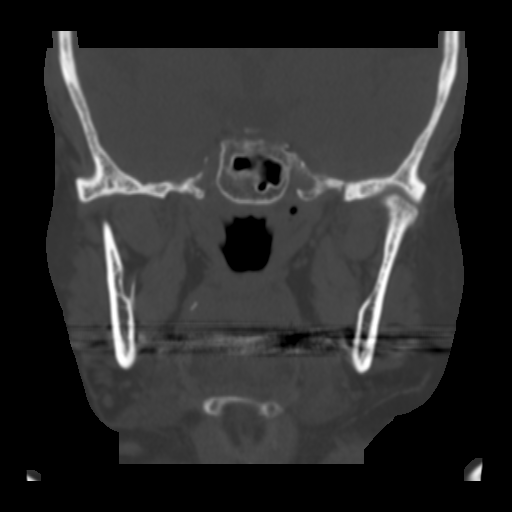

[Series 403: sag · sagittal · 0.36mm/px · 2 of 84 slices shown (2 of 2)]
[im 28/84  bone]
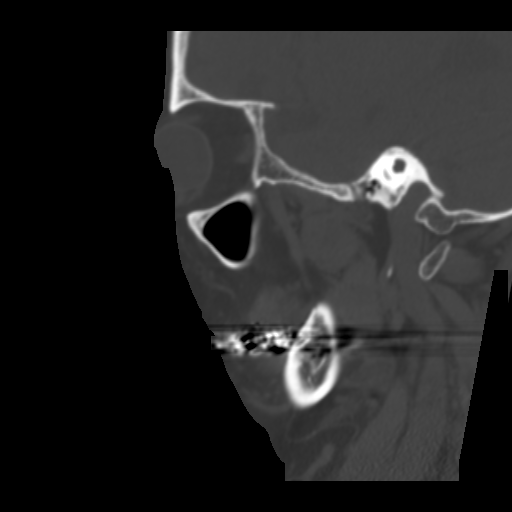
[im 56/84  bone]
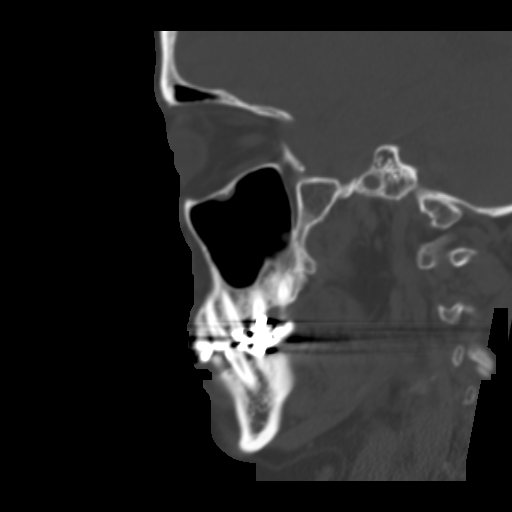

[16 of 30 positions shown; findings below may reference images not displayed]

FINDINGS: The paranasal sinuses and mastoid air cells are clear
except for minimal mucoperiosteal thickening involving a few
scattered ethmoid air cells and the maxillary sinuses.  No air-
fluid levels to suggest acute sinusitis.  The middle ear cavities
are clear. The ostiomeatal complexes are patent. Haller cells are
noted bilaterally.  The globes are intact.  The visualized portion
of the brain appears normal.  Mild cerebral atrophy is noted.
IMPRESSION: Minimal scattered ethmoid and maxillary sinus mucoperiosteal
thickening but no findings for acute sinusitis.

## 2013-09-30 ENCOUNTER — Ambulatory Visit (INDEPENDENT_AMBULATORY_CARE_PROVIDER_SITE_OTHER): Payer: Medicare HMO | Admitting: *Deleted

## 2013-09-30 DIAGNOSIS — I442 Atrioventricular block, complete: Secondary | ICD-10-CM

## 2013-09-30 LAB — MDC_IDC_ENUM_SESS_TYPE_REMOTE
Battery Impedance: 223 Ohm
Brady Statistic AP VP Percent: 16 %
Brady Statistic AP VS Percent: 0 %
Brady Statistic AS VP Percent: 84 %
Brady Statistic AS VS Percent: 0 %
Date Time Interrogation Session: 20150413144253
Lead Channel Impedance Value: 501 Ohm
Lead Channel Impedance Value: 621 Ohm
Lead Channel Pacing Threshold Amplitude: 0.875 V
Lead Channel Pacing Threshold Pulse Width: 0.4 ms
Lead Channel Sensing Intrinsic Amplitude: 2.8 mV
Lead Channel Setting Sensing Sensitivity: 2 mV
MDC IDC MSMT BATTERY REMAINING LONGEVITY: 93 mo
MDC IDC MSMT BATTERY VOLTAGE: 2.79 V
MDC IDC MSMT LEADCHNL RV PACING THRESHOLD AMPLITUDE: 1.25 V
MDC IDC MSMT LEADCHNL RV PACING THRESHOLD PULSEWIDTH: 0.4 ms
MDC IDC SET LEADCHNL RA PACING AMPLITUDE: 1.75 V
MDC IDC SET LEADCHNL RV PACING AMPLITUDE: 3.25 V
MDC IDC SET LEADCHNL RV PACING PULSEWIDTH: 0.4 ms

## 2013-10-15 ENCOUNTER — Encounter: Payer: Self-pay | Admitting: *Deleted

## 2013-10-25 ENCOUNTER — Encounter: Payer: Self-pay | Admitting: Internal Medicine

## 2014-01-01 ENCOUNTER — Ambulatory Visit (INDEPENDENT_AMBULATORY_CARE_PROVIDER_SITE_OTHER): Payer: Commercial Managed Care - HMO | Admitting: *Deleted

## 2014-01-01 DIAGNOSIS — I442 Atrioventricular block, complete: Secondary | ICD-10-CM

## 2014-01-01 DIAGNOSIS — Z950 Presence of cardiac pacemaker: Secondary | ICD-10-CM

## 2014-01-01 LAB — MDC_IDC_ENUM_SESS_TYPE_REMOTE
Battery Remaining Longevity: 88 mo
Battery Voltage: 2.78 V
Brady Statistic AP VP Percent: 18 %
Brady Statistic AS VP Percent: 81 %
Lead Channel Impedance Value: 466 Ohm
Lead Channel Sensing Intrinsic Amplitude: 2.8 mV
Lead Channel Setting Pacing Amplitude: 2.25 V
Lead Channel Setting Pacing Amplitude: 3.25 V
Lead Channel Setting Pacing Pulse Width: 0.4 ms
Lead Channel Setting Sensing Sensitivity: 2 mV
MDC IDC MSMT BATTERY IMPEDANCE: 248 Ohm
MDC IDC MSMT LEADCHNL RA PACING THRESHOLD AMPLITUDE: 1.125 V
MDC IDC MSMT LEADCHNL RA PACING THRESHOLD PULSEWIDTH: 0.4 ms
MDC IDC MSMT LEADCHNL RV IMPEDANCE VALUE: 562 Ohm
MDC IDC MSMT LEADCHNL RV PACING THRESHOLD AMPLITUDE: 1.25 V
MDC IDC MSMT LEADCHNL RV PACING THRESHOLD PULSEWIDTH: 0.4 ms
MDC IDC SESS DTM: 20150715115511
MDC IDC STAT BRADY AP VS PERCENT: 0 %
MDC IDC STAT BRADY AS VS PERCENT: 0 %

## 2014-01-01 NOTE — Progress Notes (Signed)
Remote pacemaker transmission.   

## 2014-01-10 ENCOUNTER — Telehealth: Payer: Self-pay | Admitting: Interventional Cardiology

## 2014-01-10 DIAGNOSIS — Z79899 Other long term (current) drug therapy: Secondary | ICD-10-CM

## 2014-01-10 NOTE — Telephone Encounter (Addendum)
Spoke with pts husband and pts BP when she first wakes up is 140-150's/80-90's. After pt takes medication BP will go down to 120's/70's until mid afternoon it will go back up to 622'W systolic. Pt started taking losartan 50 mg for the last two days on her own and she would like to stay on the 50 mg if Dr. Irish Lack is okay with this. Pt hasnt taken lasix in over 6 weeks, but she still takes potassium three times a week.

## 2014-01-10 NOTE — Telephone Encounter (Signed)
New message         Pt concerned about bp / pt would like to see varanasi in a week

## 2014-01-14 MED ORDER — LOSARTAN POTASSIUM 25 MG PO TABS: 50.0000 mg | ORAL_TABLET | Freq: Every day | ORAL | Status: DC

## 2014-01-14 NOTE — Telephone Encounter (Signed)
Ok to stay on losartan 50 mg.  She should have a BMet this week.

## 2014-01-14 NOTE — Telephone Encounter (Signed)
Pts husband notified, meds updated and labs ordered for 01/17/14.

## 2014-01-17 ENCOUNTER — Other Ambulatory Visit: Payer: Commercial Managed Care - HMO

## 2014-01-24 ENCOUNTER — Encounter: Payer: Self-pay | Admitting: Cardiology

## 2014-01-29 ENCOUNTER — Encounter: Payer: Self-pay | Admitting: Internal Medicine

## 2014-04-07 ENCOUNTER — Ambulatory Visit (INDEPENDENT_AMBULATORY_CARE_PROVIDER_SITE_OTHER): Payer: Commercial Managed Care - HMO | Admitting: *Deleted

## 2014-04-07 DIAGNOSIS — I442 Atrioventricular block, complete: Secondary | ICD-10-CM

## 2014-04-07 LAB — MDC_IDC_ENUM_SESS_TYPE_REMOTE
Battery Remaining Longevity: 86 mo
Battery Voltage: 2.78 V
Brady Statistic AP VS Percent: 0 %
Brady Statistic AS VP Percent: 81 %
Lead Channel Impedance Value: 573 Ohm
Lead Channel Pacing Threshold Amplitude: 1 V
Lead Channel Pacing Threshold Amplitude: 1.25 V
Lead Channel Pacing Threshold Pulse Width: 0.4 ms
Lead Channel Sensing Intrinsic Amplitude: 2.8 mV
Lead Channel Setting Pacing Amplitude: 2 V
Lead Channel Setting Pacing Pulse Width: 0.4 ms
MDC IDC MSMT BATTERY IMPEDANCE: 272 Ohm
MDC IDC MSMT LEADCHNL RA IMPEDANCE VALUE: 501 Ohm
MDC IDC MSMT LEADCHNL RA PACING THRESHOLD PULSEWIDTH: 0.4 ms
MDC IDC SESS DTM: 20151019134953
MDC IDC SET LEADCHNL RV PACING AMPLITUDE: 3.25 V
MDC IDC SET LEADCHNL RV SENSING SENSITIVITY: 2 mV
MDC IDC STAT BRADY AP VP PERCENT: 19 %
MDC IDC STAT BRADY AS VS PERCENT: 0 %

## 2014-04-07 NOTE — Progress Notes (Signed)
Remote pacemaker transmission.   

## 2014-04-11 ENCOUNTER — Ambulatory Visit: Payer: Commercial Managed Care - HMO | Admitting: Interventional Cardiology

## 2014-04-21 ENCOUNTER — Ambulatory Visit (INDEPENDENT_AMBULATORY_CARE_PROVIDER_SITE_OTHER): Payer: Commercial Managed Care - HMO | Admitting: Interventional Cardiology

## 2014-04-21 ENCOUNTER — Encounter: Payer: Self-pay | Admitting: Interventional Cardiology

## 2014-04-21 VITALS — BP 126/64 | HR 73 | Ht 60.0 in | Wt 149.4 lb

## 2014-04-21 DIAGNOSIS — I10 Essential (primary) hypertension: Secondary | ICD-10-CM

## 2014-04-21 DIAGNOSIS — Z950 Presence of cardiac pacemaker: Secondary | ICD-10-CM

## 2014-04-21 DIAGNOSIS — E782 Mixed hyperlipidemia: Secondary | ICD-10-CM

## 2014-04-21 DIAGNOSIS — R0789 Other chest pain: Secondary | ICD-10-CM

## 2014-04-21 DIAGNOSIS — R609 Edema, unspecified: Secondary | ICD-10-CM

## 2014-04-21 NOTE — Patient Instructions (Signed)
The current medical regimen is effective;  continue present plan and medications.  Follow up in 1 year with Dr. Irish Lack.  You will receive a letter in the mail 2 months before you are due.  Please call us when you receive this letter to schedule your follow up appointment.

## 2014-04-21 NOTE — Progress Notes (Signed)
Patient ID: Gabriela Phillips, female   DOB: [birth year], 73 y.o.   MRN: [id number] Patient ID: Gabriela Phillips, female   DOB: May 03, 1941, 73 y.o.   MRN: [id number]    [place], [address], [place]  [address] Phone: [phone] Fax:  [phone]  Date:  04/21/2014   ID:  Gabriela Phillips, DOB May 23, 1941, MRN [id number]  PCP:  Gennette Pac, MD      History of Present Illness: Gabriela Phillips is a 73 y.o. female who has had a pacer placed. SHe has nonobstructive CAD by cath.  SHe had some recent chest pain and then a negative stress test in 10/14.  She does not exercise much, and is not interested in starting.   She has had swelling if she sits for a while.  She feels better on increased lasix, but has not started exercising.  Hypertension:  c/o Leg edema more at the end of the day.  Denies : Dizziness.  Orthopnea.  Paroxysmal nocturnal dyspnea.  Palpitations.   Rare chest discomfort at pacer site-non-exertional; worse with breathing deeply; shortlived.  Wt Readings from Last 3 Encounters:  04/21/14 149 lb 6.4 oz (67.767 kg)  06/28/13 150 lb (68.04 kg)  04/09/13 144 lb 12.8 oz (65.681 kg)     Past Medical History  Diagnosis Date  . Complete heart block   . Hypothyroidism   . Hypertension   . 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 Radiculopathy     followed by Dr. Krista Blue  . CAD (coronary artery disease)     a. LHC (2/28): Severe HK at the apex, EF 40%, diffuse luminal irregularities, no significant CAD.;  b.  Lexiscan Myoview (2/13): EF 76%, normal perfusion; low-risk study;  c.  Lexiscan Myoview (10/14):  Low risk, no ischemia, EF 75%.  . Chronic systolic CHF (congestive heart failure)     EF previously 40%; NICM; EF recovered;  Echo (03/2010): Normal LV function, EF 68.4%, apical AK, mild MR, mild TR, normal RV systolic pressure, diastolic dysfunction.  Marland Kitchen NICM (nonischemic cardiomyopathy)     non-ischemic; resolved EF 75%; GSPECT  .  Pre-diabetes     Current Outpatient Prescriptions  Medication Sig Dispense Refill  . aspirin 81 MG tablet Take 160 mg by mouth daily.      Marland Kitchen atorvastatin (LIPITOR) 40 MG tablet Take 40 mg by mouth daily.    . carvedilol (COREG) 6.25 MG tablet Take 6.25 mg by mouth 2 (two) times daily with a meal.      . cyanocobalamin 500 MCG tablet Take 500 mcg by mouth as needed.     . furosemide (LASIX) 40 MG tablet 1-2 tablets prn for swelling 180 tablet 3  . levothyroxine (SYNTHROID, LEVOTHROID) 50 MCG tablet Take 62 mcg by mouth daily. 50 mcg every other day and 75 mcg opposite days.    Marland Kitchen LORazepam (ATIVAN) 1 MG tablet Take one tablet four times daily     . losartan (COZAAR) 100 MG tablet Take 100 mg by mouth daily.    . nitroGLYCERIN (NITROSTAT) 0.4 MG SL tablet Place 0.4 mg under the tongue every 5 (five) minutes as needed. For chest pain    . Omega-3 Fatty Acids (RA FISH OIL) 900 MG CAPS Take 4 capsules by mouth daily.     . potassium chloride SA (K-DUR,KLOR-CON) 20 MEQ tablet Take 0.5 tablets (10 mEq total) by mouth daily.     No current facility-administered medications  for this visit.    Allergies:    Allergies  Allergen Reactions  . Penicillins Other (See Comments)    Dark stool    Social History:  The patient  reports that she quit smoking about 16 years ago. She has never used smokeless tobacco. She reports that she does not drink alcohol or use illicit drugs.   Family History:  The patient's family history includes Diabetes in her mother; Liver cancer in her father.   ROS:  Please see the history of present illness.  No nausea, vomiting.  No fevers, chills.  No focal weakness.  No dysuria.    All other systems reviewed and negative.   PHYSICAL EXAM: VS:  BP 126/64 mmHg  Pulse 73  Ht 5' (1.524 m)  Wt 149 lb 6.4 oz (67.767 kg)  BMI 29.18 kg/m2 Well nourished, well developed, in no acute distress HEENT: normal Neck: no JVD, no carotid bruits Cardiac:  normal S1, S2; RRR;    Lungs:  clear to auscultation bilaterally, no wheezing, rhonchi or rales Abd: soft, nontender, no hepatomegaly Ext: no edema Skin: warm and dry Neuro:   no focal abnormalities noted Psych: normal affect    ECG: AV paced   ASSESSMENT AND PLAN:  Chest pain  Rare, atypical features. Cardiolite was negative in 10 /14 .  No further w/u at this time. Prior negative cath several years ago. 2. Essential hypertension, benign  Continue Losartan Potassium Tablet, 100 MG, 1 tablet, Orally, once every morning Refill Furosemide Tablet, 40 mg,  daily - only taking occasionally along with occasional potassium supplement  BP well controlled.  3. Cardiac pacemaker  Pacer checks  arranged.  4. Mixed hyperlipidemia  Continue Fish Oil Capsule, 900 MG, 2 capsules, Orally, twice a day Continue Atorvastatin Calcium Tablet, 40 mg, TAKE ONE TABLET BY MOUTH EVERY DAY Continue fish oil. LDL 59. LDL 47 in 2014.  Most recent lipids not available at this time.  5. Fluid retention  she would like to continue titrating her dosage of Lasix.  She'll confirm and let us know. I did warn her about the consequences of taking too much Lasix.  Now taking much less lasix than before.         Elevate legs for edema as well.  Signed, Mina Marble, MD, [facility] 04/21/2014 4:59 PM

## 2014-05-06 ENCOUNTER — Encounter: Payer: Self-pay | Admitting: Cardiology

## 2014-05-23 ENCOUNTER — Encounter: Payer: Self-pay | Admitting: Internal Medicine

## 2014-05-29 ENCOUNTER — Encounter (HOSPITAL_COMMUNITY): Payer: Self-pay | Admitting: Internal Medicine

## 2014-06-20 DIAGNOSIS — D649 Anemia, unspecified: Secondary | ICD-10-CM

## 2014-06-20 HISTORY — DX: Anemia, unspecified: D64.9

## 2014-07-01 ENCOUNTER — Encounter: Payer: Self-pay | Admitting: Internal Medicine

## 2014-07-01 ENCOUNTER — Ambulatory Visit (INDEPENDENT_AMBULATORY_CARE_PROVIDER_SITE_OTHER): Payer: Commercial Managed Care - HMO | Admitting: Internal Medicine

## 2014-07-01 VITALS — BP 124/72 | HR 82 | Ht 60.0 in | Wt 148.4 lb

## 2014-07-01 DIAGNOSIS — I442 Atrioventricular block, complete: Secondary | ICD-10-CM | POA: Diagnosis not present

## 2014-07-01 DIAGNOSIS — E782 Mixed hyperlipidemia: Secondary | ICD-10-CM

## 2014-07-01 DIAGNOSIS — Z950 Presence of cardiac pacemaker: Secondary | ICD-10-CM

## 2014-07-01 DIAGNOSIS — I10 Essential (primary) hypertension: Secondary | ICD-10-CM | POA: Diagnosis not present

## 2014-07-01 LAB — MDC_IDC_ENUM_SESS_TYPE_INCLINIC
Battery Voltage: 2.79 V
Brady Statistic AP VS Percent: 0 %
Brady Statistic AS VP Percent: 80 %
Brady Statistic AS VS Percent: 0 %
Date Time Interrogation Session: 20160112133857
Lead Channel Impedance Value: 495 Ohm
Lead Channel Impedance Value: 559 Ohm
Lead Channel Pacing Threshold Amplitude: 0.5 V
Lead Channel Pacing Threshold Amplitude: 1 V
Lead Channel Pacing Threshold Pulse Width: 0.4 ms
Lead Channel Sensing Intrinsic Amplitude: 4 mV
Lead Channel Setting Pacing Amplitude: 2 V
Lead Channel Setting Pacing Amplitude: 3.25 V
MDC IDC MSMT BATTERY IMPEDANCE: 298 Ohm
MDC IDC MSMT BATTERY REMAINING LONGEVITY: 83 mo
MDC IDC MSMT LEADCHNL RA SENSING INTR AMPL: 2.8 mV
MDC IDC MSMT LEADCHNL RV PACING THRESHOLD PULSEWIDTH: 0.4 ms
MDC IDC SET LEADCHNL RV PACING PULSEWIDTH: 0.4 ms
MDC IDC SET LEADCHNL RV SENSING SENSITIVITY: 2 mV
MDC IDC STAT BRADY AP VP PERCENT: 19 %

## 2014-07-01 NOTE — Assessment & Plan Note (Signed)
Her blood pressure is well controlled. She will continue her current meds.  

## 2014-07-01 NOTE — Assessment & Plan Note (Signed)
Her medtronic DDD PM is working normally. Will recheck in several months. 

## 2014-07-01 NOTE — Progress Notes (Signed)
HPI  Gabriela Phillips returns today for followup. She is a very pleasant 74 year old woman with a history of complete heart block, hypertension, status post permanent pacemaker insertion. In the interim, she has done well. She has minimal tenderness around her pacemaker insertion site. She states that it itches. She has class II heart failure symptoms. The patient has a prior history of left ventricular dysfunction, with normalization of her ejection fraction in the past. She denies syncope. Allergies  Allergen Reactions  . Penicillins Other (See Comments)    Dark stool     Current Outpatient Prescriptions  Medication Sig Dispense Refill  . aspirin 81 MG tablet Take 160 mg by mouth daily.      Marland Kitchen atorvastatin (LIPITOR) 40 MG tablet Take 40 mg by mouth daily.    . carvedilol (COREG) 6.25 MG tablet Take 6.25 mg by mouth 2 (two) times daily with a meal.      . cyanocobalamin 500 MCG tablet Take 500 mcg by mouth as directed.     . furosemide (LASIX) 40 MG tablet 1-2 tablets prn for swelling 180 tablet 3  . levothyroxine (SYNTHROID, LEVOTHROID) 50 MCG tablet Take 62 mcg by mouth daily. 50 mcg every other day and 75 mcg opposite days.    Marland Kitchen LORazepam (ATIVAN) 1 MG tablet Take one tablet four times daily     . losartan (COZAAR) 100 MG tablet Take 100 mg by mouth daily.    . nitroGLYCERIN (NITROSTAT) 0.4 MG SL tablet Place 0.4 mg under the tongue every 5 (five) minutes as needed. For chest pain    . Omega-3 Fatty Acids (RA FISH OIL) 900 MG CAPS Take 4 capsules by mouth daily.     . potassium chloride SA (K-DUR,KLOR-CON) 20 MEQ tablet Take 0.5 tablets (10 mEq total) by mouth daily.     No current facility-administered medications for this visit.     Past Medical History  Diagnosis Date  . Complete heart block   . Hypothyroidism   . Hypertension   . 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 Radiculopathy     followed by Dr. Krista Blue  . CAD (coronary artery disease)     a. LHC (2/28): Severe HK at the apex, EF 40%, diffuse luminal irregularities, no significant CAD.;  b.  Lexiscan Myoview (2/13): EF 76%, normal perfusion; low-risk study;  c.  Lexiscan Myoview (10/14):  Low risk, no ischemia, EF 75%.  . Chronic systolic CHF (congestive heart failure)     EF previously 40%; NICM; EF recovered;  Echo (03/2010): Normal LV function, EF 68.4%, apical AK, mild MR, mild TR, normal RV systolic pressure, diastolic dysfunction.  Marland Kitchen NICM (nonischemic cardiomyopathy)     non-ischemic; resolved EF 75%; GSPECT  . Pre-diabetes     ROS:   All systems reviewed and negative except as noted in the HPI.   Past Surgical History  Procedure Laterality Date  . Pacemaker insertion  2005    complete heart block  . Breast enhancement surgery    . Doppler echocardiography  03/2010    with normal left ventricular function, ejection fraction 68%, apical akinesis and mild mitral regurgitation  . Pacemaker generator change N/A 06/27/2011    Procedure: PACEMAKER GENERATOR CHANGE;  Surgeon: Evans Lance, MD;  Location: [facility] CATH LAB;  Service: Cardiovascular;  Laterality: N/A;     Family History  Problem Relation Age of Onset  . Diabetes Mother   . Liver cancer  Father      History   Social History  . Marital Status: Married    Spouse Name: N/A    Number of Children: N/A  . Years of Education: N/A   Occupational History  . Not on file.   Social History Main Topics  . Smoking status: Former Smoker    Quit date: 06/23/1997  . Smokeless tobacco: Never Used  . Alcohol Use: No  . Drug Use: No  . Sexual Activity: Not on file   Other Topics Concern  . Not on file   Social History Narrative     BP 124/72 mmHg  Pulse 82  Ht 5' (1.524 m)  Wt 148 lb 6.4 oz (67.314 kg)  BMI 28.98 kg/m2  Physical Exam:  Well appearing 74 yo woman, NAD HEENT: Unremarkable Neck:  7 cm JVD, no thyromegally Back:  No CVA tenderness Lungs:  Clear with no wheezes, rales, or  rhonchi.Well-healed pacemaker insertion site. HEART:  Regular rate rhythm, no murmurs, no rubs, no clicks Abd:  soft, positive bowel sounds, no organomegally, no rebound, no guarding Ext:  2 plus pulses, no edema, no cyanosis, no clubbing Skin:  No rashes no nodules Neuro:  CN II through XII intact, motor grossly intact  DEVICE  Normal device function.  See PaceArt for details.   Assess/Plan:

## 2014-07-01 NOTE — Patient Instructions (Signed)
Your physician wants you to follow-up in: 12 months with Dr. Knox Saliva will receive a reminder letter in the mail two months in advance. If you don't receive a letter, please call our office to schedule the follow-up appointment.  Remote monitoring is used to monitor your Pacemaker or ICD from home. This monitoring reduces the number of office visits required to check your device to one time per year. It allows [place] to keep an eye on the functioning of your device to ensure it is working properly. You are scheduled for a device check from home on 09/30/14. You may send your transmission at any time that day. If you have a wireless device, the transmission will be sent automatically. After your physician reviews your transmission, you will receive a postcard with your next transmission date.

## 2014-07-01 NOTE — Assessment & Plan Note (Signed)
She will continue her statin therapy.

## 2014-08-20 DIAGNOSIS — H52203 Unspecified astigmatism, bilateral: Secondary | ICD-10-CM | POA: Diagnosis not present

## 2014-08-20 DIAGNOSIS — Z961 Presence of intraocular lens: Secondary | ICD-10-CM | POA: Diagnosis not present

## 2014-09-10 DIAGNOSIS — E039 Hypothyroidism, unspecified: Secondary | ICD-10-CM | POA: Diagnosis not present

## 2014-09-10 DIAGNOSIS — I10 Essential (primary) hypertension: Secondary | ICD-10-CM | POA: Diagnosis not present

## 2014-09-10 DIAGNOSIS — I442 Atrioventricular block, complete: Secondary | ICD-10-CM | POA: Diagnosis not present

## 2014-09-10 DIAGNOSIS — E782 Mixed hyperlipidemia: Secondary | ICD-10-CM | POA: Diagnosis not present

## 2014-09-10 DIAGNOSIS — M549 Dorsalgia, unspecified: Secondary | ICD-10-CM | POA: Diagnosis not present

## 2014-09-10 DIAGNOSIS — N183 Chronic kidney disease, stage 3 (moderate): Secondary | ICD-10-CM | POA: Diagnosis not present

## 2014-09-10 DIAGNOSIS — I429 Cardiomyopathy, unspecified: Secondary | ICD-10-CM | POA: Diagnosis not present

## 2014-09-10 DIAGNOSIS — R7301 Impaired fasting glucose: Secondary | ICD-10-CM | POA: Diagnosis not present

## 2014-09-30 ENCOUNTER — Telehealth: Payer: Self-pay | Admitting: Cardiology

## 2014-09-30 ENCOUNTER — Ambulatory Visit (INDEPENDENT_AMBULATORY_CARE_PROVIDER_SITE_OTHER): Payer: Commercial Managed Care - HMO | Admitting: *Deleted

## 2014-09-30 DIAGNOSIS — I442 Atrioventricular block, complete: Secondary | ICD-10-CM

## 2014-09-30 NOTE — Progress Notes (Signed)
Remote pacemaker transmission.   

## 2014-09-30 NOTE — Telephone Encounter (Signed)
LMOVM reminding pt to send remote transmission.   

## 2014-10-05 LAB — MDC_IDC_ENUM_SESS_TYPE_REMOTE
Battery Impedance: 372 Ohm
Battery Remaining Longevity: 65 mo
Battery Voltage: 2.79 V
Brady Statistic AP VP Percent: 33 %
Brady Statistic AP VS Percent: 0 %
Brady Statistic AS VP Percent: 66 %
Date Time Interrogation Session: 20160412155943
Lead Channel Impedance Value: 487 Ohm
Lead Channel Impedance Value: 501 Ohm
Lead Channel Pacing Threshold Amplitude: 1 V
Lead Channel Pacing Threshold Amplitude: 1.5 V
Lead Channel Pacing Threshold Pulse Width: 0.4 ms
Lead Channel Pacing Threshold Pulse Width: 0.4 ms
Lead Channel Sensing Intrinsic Amplitude: 2.8 mV
Lead Channel Setting Pacing Amplitude: 3.75 V
Lead Channel Setting Sensing Sensitivity: 2 mV
MDC IDC SET LEADCHNL RA PACING AMPLITUDE: 2 V
MDC IDC SET LEADCHNL RV PACING PULSEWIDTH: 0.4 ms
MDC IDC STAT BRADY AS VS PERCENT: 0 %

## 2014-10-13 ENCOUNTER — Encounter: Payer: Self-pay | Admitting: Cardiology

## 2014-10-16 ENCOUNTER — Encounter: Payer: Self-pay | Admitting: Internal Medicine

## 2014-12-04 DIAGNOSIS — M5416 Radiculopathy, lumbar region: Secondary | ICD-10-CM | POA: Diagnosis not present

## 2014-12-04 DIAGNOSIS — M899 Disorder of bone, unspecified: Secondary | ICD-10-CM | POA: Diagnosis not present

## 2014-12-04 DIAGNOSIS — Z0000 Encounter for general adult medical examination without abnormal findings: Secondary | ICD-10-CM | POA: Diagnosis not present

## 2014-12-04 DIAGNOSIS — Z950 Presence of cardiac pacemaker: Secondary | ICD-10-CM | POA: Diagnosis not present

## 2014-12-04 DIAGNOSIS — E782 Mixed hyperlipidemia: Secondary | ICD-10-CM | POA: Diagnosis not present

## 2014-12-04 DIAGNOSIS — I10 Essential (primary) hypertension: Secondary | ICD-10-CM | POA: Diagnosis not present

## 2014-12-04 DIAGNOSIS — R7301 Impaired fasting glucose: Secondary | ICD-10-CM | POA: Diagnosis not present

## 2014-12-04 DIAGNOSIS — M8580 Other specified disorders of bone density and structure, unspecified site: Secondary | ICD-10-CM | POA: Diagnosis not present

## 2014-12-04 DIAGNOSIS — E039 Hypothyroidism, unspecified: Secondary | ICD-10-CM | POA: Diagnosis not present

## 2014-12-05 DIAGNOSIS — D649 Anemia, unspecified: Secondary | ICD-10-CM | POA: Diagnosis not present

## 2014-12-05 DIAGNOSIS — K922 Gastrointestinal hemorrhage, unspecified: Secondary | ICD-10-CM | POA: Diagnosis not present

## 2014-12-05 DIAGNOSIS — K921 Melena: Secondary | ICD-10-CM | POA: Diagnosis not present

## 2014-12-08 DIAGNOSIS — D649 Anemia, unspecified: Secondary | ICD-10-CM | POA: Diagnosis not present

## 2014-12-09 DIAGNOSIS — K259 Gastric ulcer, unspecified as acute or chronic, without hemorrhage or perforation: Secondary | ICD-10-CM | POA: Diagnosis not present

## 2014-12-09 DIAGNOSIS — K921 Melena: Secondary | ICD-10-CM | POA: Diagnosis not present

## 2014-12-09 DIAGNOSIS — K2950 Unspecified chronic gastritis without bleeding: Secondary | ICD-10-CM | POA: Diagnosis not present

## 2014-12-09 DIAGNOSIS — R1013 Epigastric pain: Secondary | ICD-10-CM | POA: Diagnosis not present

## 2014-12-09 DIAGNOSIS — D500 Iron deficiency anemia secondary to blood loss (chronic): Secondary | ICD-10-CM | POA: Diagnosis not present

## 2014-12-11 DIAGNOSIS — K922 Gastrointestinal hemorrhage, unspecified: Secondary | ICD-10-CM | POA: Diagnosis not present

## 2014-12-17 DIAGNOSIS — K921 Melena: Secondary | ICD-10-CM | POA: Diagnosis not present

## 2014-12-17 DIAGNOSIS — K250 Acute gastric ulcer with hemorrhage: Secondary | ICD-10-CM | POA: Diagnosis not present

## 2014-12-31 ENCOUNTER — Ambulatory Visit (INDEPENDENT_AMBULATORY_CARE_PROVIDER_SITE_OTHER): Payer: Commercial Managed Care - HMO | Admitting: *Deleted

## 2014-12-31 ENCOUNTER — Telehealth: Payer: Self-pay | Admitting: Cardiology

## 2014-12-31 DIAGNOSIS — I442 Atrioventricular block, complete: Secondary | ICD-10-CM

## 2014-12-31 NOTE — Telephone Encounter (Signed)
Spoke with pt and reminded pt of remote transmission that is due today. Pt verbalized understanding.   

## 2014-12-31 NOTE — Progress Notes (Signed)
Remote pacemaker transmission.   

## 2015-01-06 LAB — CUP PACEART REMOTE DEVICE CHECK
Battery Remaining Longevity: 75 mo
Brady Statistic AP VP Percent: 35 %
Brady Statistic AP VS Percent: 0 %
Date Time Interrogation Session: 20160713151127
Lead Channel Impedance Value: 604 Ohm
Lead Channel Pacing Threshold Amplitude: 1.125 V
Lead Channel Pacing Threshold Amplitude: 1.25 V
Lead Channel Pacing Threshold Pulse Width: 0.4 ms
Lead Channel Sensing Intrinsic Amplitude: 2.8 mV
Lead Channel Setting Pacing Amplitude: 2.25 V
Lead Channel Setting Pacing Pulse Width: 0.4 ms
Lead Channel Setting Sensing Sensitivity: 2 mV
MDC IDC MSMT BATTERY IMPEDANCE: 422 Ohm
MDC IDC MSMT BATTERY VOLTAGE: 2.78 V
MDC IDC MSMT LEADCHNL RA IMPEDANCE VALUE: 466 Ohm
MDC IDC MSMT LEADCHNL RV PACING THRESHOLD PULSEWIDTH: 0.4 ms
MDC IDC SET LEADCHNL RV PACING AMPLITUDE: 3.25 V
MDC IDC STAT BRADY AS VP PERCENT: 64 %
MDC IDC STAT BRADY AS VS PERCENT: 0 %

## 2015-01-23 ENCOUNTER — Encounter: Payer: Self-pay | Admitting: Cardiology

## 2015-01-26 DIAGNOSIS — D649 Anemia, unspecified: Secondary | ICD-10-CM | POA: Diagnosis not present

## 2015-01-26 DIAGNOSIS — K3189 Other diseases of stomach and duodenum: Secondary | ICD-10-CM | POA: Diagnosis not present

## 2015-01-26 DIAGNOSIS — K253 Acute gastric ulcer without hemorrhage or perforation: Secondary | ICD-10-CM | POA: Diagnosis not present

## 2015-01-26 DIAGNOSIS — K2970 Gastritis, unspecified, without bleeding: Secondary | ICD-10-CM | POA: Diagnosis not present

## 2015-01-27 ENCOUNTER — Encounter: Payer: Self-pay | Admitting: Internal Medicine

## 2015-02-10 ENCOUNTER — Encounter: Payer: Self-pay | Admitting: Interventional Cardiology

## 2015-02-27 DIAGNOSIS — Z1231 Encounter for screening mammogram for malignant neoplasm of breast: Secondary | ICD-10-CM | POA: Diagnosis not present

## 2015-02-27 DIAGNOSIS — M810 Age-related osteoporosis without current pathological fracture: Secondary | ICD-10-CM | POA: Diagnosis not present

## 2015-04-02 ENCOUNTER — Telehealth: Payer: Self-pay | Admitting: Cardiology

## 2015-04-02 ENCOUNTER — Ambulatory Visit (INDEPENDENT_AMBULATORY_CARE_PROVIDER_SITE_OTHER): Payer: Commercial Managed Care - HMO | Admitting: *Deleted

## 2015-04-02 DIAGNOSIS — I442 Atrioventricular block, complete: Secondary | ICD-10-CM | POA: Diagnosis not present

## 2015-04-02 NOTE — Progress Notes (Signed)
Remote pacemaker transmission.   

## 2015-04-02 NOTE — Telephone Encounter (Signed)
LMOVM reminding pt to send remote transmission.   

## 2015-04-03 LAB — CUP PACEART REMOTE DEVICE CHECK
Battery Voltage: 2.78 V
Brady Statistic AP VS Percent: 0 %
Brady Statistic AS VP Percent: 63 %
Brady Statistic AS VS Percent: 0 %
Implantable Lead Implant Date: 20130107
Implantable Lead Location: 753860
Implantable Lead Model: 5076
Implantable Lead Model: 5092
Lead Channel Impedance Value: 558 Ohm
Lead Channel Pacing Threshold Amplitude: 1 V
Lead Channel Pacing Threshold Amplitude: 1.25 V
Lead Channel Pacing Threshold Pulse Width: 0.4 ms
Lead Channel Pacing Threshold Pulse Width: 0.4 ms
Lead Channel Setting Pacing Amplitude: 2 V
Lead Channel Setting Pacing Pulse Width: 0.4 ms
Lead Channel Setting Sensing Sensitivity: 2 mV
MDC IDC LEAD IMPLANT DT: 20130107
MDC IDC LEAD LOCATION: 753859
MDC IDC MSMT BATTERY IMPEDANCE: 446 Ohm
MDC IDC MSMT BATTERY REMAINING LONGEVITY: 72 mo
MDC IDC MSMT LEADCHNL RA IMPEDANCE VALUE: 532 Ohm
MDC IDC MSMT LEADCHNL RA SENSING INTR AMPL: 2.8 mV
MDC IDC SESS DTM: 20161013171509
MDC IDC SET LEADCHNL RV PACING AMPLITUDE: 3.25 V
MDC IDC STAT BRADY AP VP PERCENT: 36 %

## 2015-04-07 ENCOUNTER — Encounter: Payer: Self-pay | Admitting: Cardiology

## 2015-04-22 DIAGNOSIS — D485 Neoplasm of uncertain behavior of skin: Secondary | ICD-10-CM | POA: Diagnosis not present

## 2015-04-22 DIAGNOSIS — D62 Acute posthemorrhagic anemia: Secondary | ICD-10-CM | POA: Diagnosis not present

## 2015-04-22 DIAGNOSIS — R7301 Impaired fasting glucose: Secondary | ICD-10-CM | POA: Diagnosis not present

## 2015-05-25 NOTE — Progress Notes (Signed)
Patient ID: Gabriela Phillips, female   DOB: [birth year], 74 y.o.   MRN: [id number]     Cardiology Office Note   Date:  05/26/2015   ID:  Gabriela Phillips, DOB 1940-06-30, MRN [id number]  PCP:  Gennette Pac, MD    No chief complaint on file. f/u CHF   Wt Readings from Last 3 Encounters:  05/26/15 148 lb (67.132 kg)  07/01/14 148 lb 6.4 oz (67.314 kg)  04/21/14 149 lb 6.4 oz (67.767 kg)       History of Present Illness: Gabriela Phillips is a 74 y.o. female  who has had a pacer placed. SHe has nonobstructive CAD by cath in 2008. SHe had some chest pain and then a negative stress test in 10/14. She does not exercise much, and is not interested in starting. She has had swelling if she sits for a while. She feels better on increased lasix, but has not started exercising.  Hypertension:  c/o Leg edema more at the end of the day.  Denies : Dizziness.  Orthopnea.  Paroxysmal nocturnal dyspnea.  Palpitations.   Rare chest discomfort at pacer site-non-exertional; worse with breathing deeply; shortlived.  She has gas pain and then it resolves with releasing her gas.  She feels it more after spicy foods.   She walks regularly but has no chest pain with walking.    Past Medical History  Diagnosis Date  . Complete heart block ([place])   . Hypothyroidism   . Hypertension   . 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 Radiculopathy     followed by Dr. Krista Blue  . CAD (coronary artery disease)     a. LHC (2/28): Severe HK at the apex, EF 40%, diffuse luminal irregularities, no significant CAD.;  b.  Lexiscan Myoview (2/13): EF 76%, normal perfusion; low-risk study;  c.  Lexiscan Myoview (10/14):  Low risk, no ischemia, EF 75%.  . Chronic systolic CHF (congestive heart failure) ([facility])     EF previously 40%; NICM; EF recovered;  Echo (03/2010): Normal LV function, EF 68.4%, apical AK, mild MR, mild TR, normal RV systolic pressure, diastolic dysfunction.  Marland Kitchen NICM  (nonischemic cardiomyopathy) ([place])     non-ischemic; resolved EF 75%; GSPECT  . Pre-diabetes     Past Surgical History  Procedure Laterality Date  . Pacemaker insertion  2005    complete heart block  . Breast enhancement surgery    . Doppler echocardiography  03/2010    with normal left ventricular function, ejection fraction 68%, apical akinesis and mild mitral regurgitation  . Pacemaker generator change N/A 06/27/2011    Procedure: PACEMAKER GENERATOR CHANGE;  Surgeon: Evans Lance, MD;  Location: [facility] CATH LAB;  Service: Cardiovascular;  Laterality: N/A;     Current Outpatient Prescriptions  Medication Sig Dispense Refill  . aspirin 81 MG tablet Take 160 mg by mouth daily.      Marland Kitchen atorvastatin (LIPITOR) 40 MG tablet Take 40 mg by mouth daily.    . carvedilol (COREG) 6.25 MG tablet Take 6.25 mg by mouth 2 (two) times daily with a meal.      . cyanocobalamin 500 MCG tablet Take 500 mcg by mouth as directed.     Marland Kitchen FLUZONE HIGH-DOSE 0.5 ML SUSY ADM 0.5ML IM UTD  0  . furosemide (LASIX) 40 MG tablet 1-2 tablets prn for swelling 180 tablet 3  . levothyroxine (SYNTHROID, LEVOTHROID) 50 MCG tablet Take 62 mcg by mouth daily. [place]  mcg every other day and 75 mcg opposite days.    Marland Kitchen LORazepam (ATIVAN) 1 MG tablet Take one tablet four times daily     . losartan (COZAAR) 100 MG tablet Take 100 mg by mouth daily.    . nitroGLYCERIN (NITROSTAT) 0.4 MG SL tablet Place 0.4 mg under the tongue every 5 (five) minutes as needed. For chest pain    . Omega-3 Fatty Acids (RA FISH OIL) 900 MG CAPS Take 4 capsules by mouth daily.     . potassium chloride SA (K-DUR,KLOR-CON) 20 MEQ tablet Take 0.5 tablets (10 mEq total) by mouth daily.     No current facility-administered medications for this visit.    Allergies:   Penicillins    Social History:  The patient  reports that she quit smoking about 17 years ago. She has never used smokeless tobacco. She reports that she does not drink alcohol or use illicit  drugs.   Family History:  The patient's family history includes Diabetes in her father and mother; Liver cancer in her father. There is no history of Heart attack, Stroke, or Hypertension.    ROS:  Please see the history of present illness.   Otherwise, review of systems are positive for occasional gas pain.   All other systems are reviewed and negative.    PHYSICAL EXAM: VS:  BP 110/50 mmHg  Pulse 80  Ht 5' (1.524 m)  Wt 148 lb (67.132 kg)  BMI 28.90 kg/m2 , BMI Body mass index is 28.9 kg/(m^2). GEN: Well nourished, well developed, in no acute distress HEENT: normal Neck: no JVD, carotid bruits, or masses Cardiac: RRR; no murmurs, rubs, or gallops,no edema  Respiratory:  clear to auscultation bilaterally, normal work of breathing GI: soft, nontender, nondistended, + BS MS: no deformity or atrophy Skin: warm and dry, no rash Neuro:  Strength and sensation are intact Psych: euthymic mood, full affect   EKG:   The ekg ordered today demonstrates AV pacing   Recent Labs: No results found for requested labs within last 365 days.   Lipid Panel No results found for: CHOL, TRIG, HDL, CHOLHDL, VLDL, LDLCALC, LDLDIRECT   Other studies Reviewed: Additional studies/ records that were reviewed today with results demonstrating: [place] EF 68% in 2014 .   ASSESSMENT AND PLAN:  1. Pacer: f/u with Dr. Lovena Le.  2. HTN: Stable.  Continue current meds and regular exercise.  3. Hyperlipidemia: Followed by Dr. Artis Flock have been elevated.   4. Fluid retention.  She adjusts her dose of Lasix at home. Stable.    Current medicines are reviewed at length with the patient today.  The patient concerns regarding her medicines were addressed.  The following changes have been made:  No change  Labs/ tests ordered today include:  No orders of the defined types were placed in this encounter.    Recommend 150 minutes/week of aerobic exercise Low fat, low carb, high fiber diet  recommended  Disposition:   Patient does not like to come to the office to see two different doctors.  SHe has been stable from a non-EP standpoint.  SHe can see me prn for non-EP issues.  She will f/u with Dr. Lovena Le annually.    Teresita Madura., MD  05/26/2015 4:16 PM    [facility] [address], [place]   [address] Phone: [phone]; Fax: [phone]

## 2015-05-26 ENCOUNTER — Encounter: Payer: Self-pay | Admitting: Interventional Cardiology

## 2015-05-26 ENCOUNTER — Ambulatory Visit (INDEPENDENT_AMBULATORY_CARE_PROVIDER_SITE_OTHER): Payer: Commercial Managed Care - HMO | Admitting: Interventional Cardiology

## 2015-05-26 VITALS — BP 110/50 | HR 80 | Ht 60.0 in | Wt 148.0 lb

## 2015-05-26 DIAGNOSIS — I10 Essential (primary) hypertension: Secondary | ICD-10-CM

## 2015-05-26 DIAGNOSIS — E782 Mixed hyperlipidemia: Secondary | ICD-10-CM

## 2015-05-26 DIAGNOSIS — Z950 Presence of cardiac pacemaker: Secondary | ICD-10-CM

## 2015-05-26 DIAGNOSIS — R609 Edema, unspecified: Secondary | ICD-10-CM

## 2015-05-26 DIAGNOSIS — I429 Cardiomyopathy, unspecified: Secondary | ICD-10-CM | POA: Diagnosis not present

## 2015-05-26 NOTE — Patient Instructions (Signed)
Medication Instructions:  Your physician recommends that you continue on your current medications as directed. Please refer to the Current Medication list given to you today.   Labwork: None ordered  Testing/Procedures: None ordered  Follow-Up: Your physician recommends that you schedule a follow-up appointment with Dr.[name] as needed  Your physician recommends that you schedule a follow-up appointment with Dr.Taylor as planned   Any Other Special Instructions Will Be Listed Below (If Applicable).     If you need a refill on your cardiac medications before your next appointment, please call your pharmacy.

## 2015-06-06 DIAGNOSIS — J0100 Acute maxillary sinusitis, unspecified: Secondary | ICD-10-CM | POA: Diagnosis not present

## 2015-07-22 ENCOUNTER — Encounter: Payer: Self-pay | Admitting: *Deleted

## 2015-09-10 DIAGNOSIS — D649 Anemia, unspecified: Secondary | ICD-10-CM | POA: Diagnosis not present

## 2015-09-10 DIAGNOSIS — I10 Essential (primary) hypertension: Secondary | ICD-10-CM | POA: Diagnosis not present

## 2015-09-10 DIAGNOSIS — R5383 Other fatigue: Secondary | ICD-10-CM | POA: Diagnosis not present

## 2015-09-10 DIAGNOSIS — E039 Hypothyroidism, unspecified: Secondary | ICD-10-CM | POA: Diagnosis not present

## 2015-09-10 DIAGNOSIS — Z950 Presence of cardiac pacemaker: Secondary | ICD-10-CM | POA: Diagnosis not present

## 2015-09-10 DIAGNOSIS — Z8719 Personal history of other diseases of the digestive system: Secondary | ICD-10-CM | POA: Diagnosis not present

## 2015-09-10 DIAGNOSIS — R7301 Impaired fasting glucose: Secondary | ICD-10-CM | POA: Diagnosis not present

## 2015-09-10 DIAGNOSIS — M791 Myalgia: Secondary | ICD-10-CM | POA: Diagnosis not present

## 2015-09-15 ENCOUNTER — Encounter: Payer: Commercial Managed Care - HMO | Attending: Family Medicine | Admitting: *Deleted

## 2015-09-15 ENCOUNTER — Encounter: Payer: Self-pay | Admitting: *Deleted

## 2015-09-15 VITALS — Ht 60.0 in | Wt 150.4 lb

## 2015-09-15 DIAGNOSIS — E119 Type 2 diabetes mellitus without complications: Secondary | ICD-10-CM

## 2015-09-15 NOTE — Patient Instructions (Addendum)
Plan:  Include protein in moderation with your meals and snacks continue  increasing your activity level by walk daily as tolerated Continue taking medication as directed by MD  Breakfast: 1 toast, 1 egg, 3oz steak Oatmeal, banana, nuts or tofu  Activia Light Yogurt Dannon Light & Fit Greek Yogurt   Rice only 2/3C at a time Goulosh total of 2C  Olive Oil is better for your , next is canola oil  Come back and see me as you need.

## 2015-09-15 NOTE — Progress Notes (Signed)
Diabetes Self-Management Education  Visit Type: First/Initial  Appt. Start Time: 1400 Appt. End Time: [id number]  09/15/2015  Gabriela Phillips, identified by name and date of birth, is a 75 y.o. female with a diagnosis of Diabetes: Type 2. [name] presents today with her husband after receiving a new diagnosis of T2DM. She has had Pre-Diabetes for a few years. [name] is from [place] and does well with the [place] language. [name] does the grocery shopping and Taisia does the cooking. In review of her dietary intake, portion control is of primary concern. Rice and pasta specifically. Her husband is very helpful and supportive of making the lifestyle modifications needed.  ASSESSMENT  Height 5' (1.524 m), weight 150 lb 6.4 oz (68.221 kg). Body mass index is 29.37 kg/(m^2).      Diabetes Self-Management Education - 09/15/15 1422    Visit Information   Visit Type First/Initial   Initial Visit   Diabetes Type Type 2   Are you currently following a meal plan? No   Are you taking your medications as prescribed? Not on Medications   Date Diagnosed 08/2015   Health Coping   How would you rate your overall health? Fair   Psychosocial Assessment   Patient Belief/Attitude about Diabetes Motivated to manage diabetes   Self-care barriers English as a second language   Self-management support Doctor's office;Friends;Family;CDE visits   Other persons present Patient;Spouse/SO   Patient Concerns Nutrition/Meal planning;Healthy Lifestyle;Problem Solving;Weight Control;Glycemic Control   Special Needs Instruct caregiver   Preferred Learning Style No preference indicated   Learning Readiness Change in progress   How often do you need to have someone help you when you read instructions, pamphlets, or other written materials from your doctor or pharmacy? 4 - Often   Complications   Last HgB A1C per patient/outside source 6.8 %   How often do you check your blood sugar? Not recommended by  provider   Have you had a dilated eye exam in the past 12 months? Yes   Have you had a dental exam in the past 12 months? Yes   Are you checking your feet? No   Dietary Intake   Breakfast oatmeal 3/4C, 1/2banana, ginsing powder & hot water / 1 toast, 2 eggs, 4 oz steak, water   Lunch chicken breast, onion, lettuce,water, cheetos 1oz bag / grilled chicken, compote, vegetables, french fries   Snack (afternoon) fruit   Beverage(s) water, diet sprite   Exercise   Exercise Type Light (walking / raking leaves)   How many days per week to you exercise? 7   How many minutes per day do you exercise? 25   Total minutes per week of exercise 175   Patient Education   Previous Diabetes Education No   Disease state  Definition of diabetes, type 1 and 2, and the diagnosis of diabetes;Factors that contribute to the development of diabetes   Nutrition management  Role of diet in the treatment of diabetes and the relationship between the three main macronutrients and blood glucose level;Information on hints to eating out and maintain blood glucose control.;Meal options for control of blood glucose level and chronic complications.   Physical activity and exercise  Role of exercise on diabetes management, blood pressure control and cardiac health.   Chronic complications Relationship between chronic complications and blood glucose control;Lipid levels, blood glucose control and heart disease;Identified and discussed with patient  current chronic complications;Reviewed with patient heart disease, higher risk of, and prevention   Psychosocial  adjustment Identified and addressed patients feelings and concerns about diabetes   Personal strategies to promote health Lifestyle issues that need to be addressed for better diabetes care   Individualized Goals (developed by patient)   Nutrition General guidelines for healthy choices and portions discussed   Physical Activity Exercise 5-7 days per week;30 minutes per day    Outcomes   Expected Outcomes Demonstrated interest in learning. Expect positive outcomes   Future DMSE PRN   Program Status Completed      Individualized Plan for Diabetes Self-Management Training:   Learning Objective:  Patient will have a greater understanding of diabetes self-management. Patient education plan is to attend individual and/or group sessions per assessed needs and concerns.   Plan:   Patient Instructions  Plan:  Include protein in moderation with your meals and snacks continue  increasing your activity level by walk daily as tolerated Continue taking medication as directed by MD  Breakfast: 1 toast, 1 egg, 3oz steak Oatmeal, banana, nuts or tofu  Activia Light Yogurt Dannon Light & Fit Greek Yogurt   Rice only 2/3C at a time Goulosh total of 2C  Olive Oil is better for your , next is canola oil  Come back and see me as you need.   Expected Outcomes:  Demonstrated interest in learning. Expect positive outcomes  Education material provided: Living Well with Diabetes, A1C conversion sheet, Meal plan card and My Plate  If problems or questions, patient to contact team via:  Phone  Future DSME appointment: PRN

## 2015-09-16 ENCOUNTER — Encounter: Payer: Self-pay | Admitting: Internal Medicine

## 2015-09-16 ENCOUNTER — Ambulatory Visit (INDEPENDENT_AMBULATORY_CARE_PROVIDER_SITE_OTHER): Payer: Commercial Managed Care - HMO | Admitting: Internal Medicine

## 2015-09-16 VITALS — BP 124/66 | HR 82 | Ht 60.0 in | Wt 150.4 lb

## 2015-09-16 DIAGNOSIS — I5032 Chronic diastolic (congestive) heart failure: Secondary | ICD-10-CM

## 2015-09-16 LAB — CUP PACEART INCLINIC DEVICE CHECK
Battery Impedance: 624 Ohm
Battery Remaining Longevity: 61 mo
Battery Voltage: 2.77 V
Brady Statistic AP VP Percent: 39 %
Brady Statistic AS VP Percent: 60 %
Date Time Interrogation Session: 20170329161908
Implantable Lead Implant Date: 20130107
Implantable Lead Location: 753859
Implantable Lead Location: 753860
Implantable Lead Model: 5076
Implantable Lead Model: 5092
Lead Channel Impedance Value: 480 Ohm
Lead Channel Pacing Threshold Amplitude: 0.75 V
Lead Channel Pacing Threshold Amplitude: 1 V
Lead Channel Sensing Intrinsic Amplitude: 2 mV
Lead Channel Setting Pacing Amplitude: 2.5 V
Lead Channel Setting Pacing Pulse Width: 0.64 ms
MDC IDC LEAD IMPLANT DT: 20130107
MDC IDC MSMT LEADCHNL RA PACING THRESHOLD PULSEWIDTH: 0.4 ms
MDC IDC MSMT LEADCHNL RV IMPEDANCE VALUE: 583 Ohm
MDC IDC MSMT LEADCHNL RV PACING THRESHOLD PULSEWIDTH: 0.64 ms
MDC IDC SET LEADCHNL RA PACING AMPLITUDE: 2 V
MDC IDC SET LEADCHNL RV SENSING SENSITIVITY: 2 mV
MDC IDC STAT BRADY AP VS PERCENT: 0 %
MDC IDC STAT BRADY AS VS PERCENT: 0 %

## 2015-09-16 NOTE — Patient Instructions (Signed)

## 2015-09-16 NOTE — Progress Notes (Signed)
HPI  Gabriela Phillips returns today for followup. She is a very pleasant 75 year old woman with a history of complete heart block, hypertension, status post permanent pacemaker insertion. In the interim, she has done well. She has class II heart failure symptoms which have been well compensated. The patient has a prior history of left ventricular dysfunction, with normalization of her ejection fraction in the past. She denies syncope.  Allergies  Allergen Reactions  . Penicillins Other (See Comments)    Dark stool     Current Outpatient Prescriptions  Medication Sig Dispense Refill  . atorvastatin (LIPITOR) 40 MG tablet Take 40 mg by mouth daily.    . carvedilol (COREG) 6.25 MG tablet Take 6.25 mg by mouth 2 (two) times daily with a meal.      . cyanocobalamin 500 MCG tablet Take 500 mcg by mouth daily.     . furosemide (LASIX) 40 MG tablet 1-2 tablets prn for swelling 180 tablet 3  . levothyroxine (SYNTHROID, LEVOTHROID) 50 MCG tablet Take 50 mcg by mouth every other day. Alternates with Levothyroxine 75 mcg.    . levothyroxine (SYNTHROID, LEVOTHROID) 75 MCG tablet Take 75 mcg by mouth every other day. Alternates with Levothyroxine 50 mcg.    Marland Kitchen LORazepam (ATIVAN) 1 MG tablet Take 1 mg by mouth 3 (three) times daily as needed for anxiety.    Marland Kitchen losartan (COZAAR) 100 MG tablet Take 100 mg by mouth daily.    . nitroGLYCERIN (NITROSTAT) 0.4 MG SL tablet Place 0.4 mg under the tongue every 5 (five) minutes as needed. For chest pain    . Omega-3 Fatty Acids (RA FISH OIL) 900 MG CAPS Take 4 capsules by mouth daily.     . potassium chloride SA (K-DUR,KLOR-CON) 20 MEQ tablet Take 20 mEq by mouth daily.     No current facility-administered medications for this visit.     Past Medical History  Diagnosis Date  . Complete heart block ([place])   . Hypothyroidism   . Hypertension   . 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 Radiculopathy     followed by Dr. Krista Blue  . CAD  (coronary artery disease)     a. LHC (2/28): Severe HK at the apex, EF 40%, diffuse luminal irregularities, no significant CAD.;  b.  Lexiscan Myoview (2/13): EF 76%, normal perfusion; low-risk study;  c.  Lexiscan Myoview (10/14):  Low risk, no ischemia, EF 75%.  . Chronic systolic CHF (congestive heart failure) (HCC)     EF previously 40%; NICM; EF recovered;  Echo (03/2010): Normal LV function, EF 68.4%, apical AK, mild MR, mild TR, normal RV systolic pressure, diastolic dysfunction.  Marland Kitchen NICM (nonischemic cardiomyopathy) ([place])     non-ischemic; resolved EF 75%; GSPECT  . Pre-diabetes   . Diabetes mellitus without complication ([place])     ROS:   All systems reviewed and negative except as noted in the HPI.   Past Surgical History  Procedure Laterality Date  . Pacemaker insertion  2005    complete heart block  . Breast enhancement surgery    . Doppler echocardiography  03/2010    with normal left ventricular function, ejection fraction 68%, apical akinesis and mild mitral regurgitation  . Pacemaker generator change N/A 06/27/2011    Procedure: PACEMAKER GENERATOR CHANGE;  Surgeon: Evans Lance, MD;  Location: [facility] CATH LAB;  Service: Cardiovascular;  Laterality: N/A;     Family History  Problem Relation Age of  Onset  . Diabetes Mother   . Liver cancer Father   . Diabetes Father   . Heart attack Neg Hx   . Stroke Neg Hx   . Hypertension Neg Hx   . Diabetes Sister      Social History   Social History  . Marital Status: Married    Spouse Name: N/A  . Number of Children: N/A  . Years of Education: N/A   Occupational History  . Not on file.   Social History Main Topics  . Smoking status: Former Smoker    Quit date: 06/23/1997  . Smokeless tobacco: Never Used  . Alcohol Use: No  . Drug Use: No  . Sexual Activity: Not on file   Other Topics Concern  . Not on file   Social History Narrative     BP 124/66 mmHg  Pulse 82  Ht 5' (1.524 m)  Wt 150 lb 6.4 oz (68.221  kg)  BMI 29.37 kg/m2  Physical Exam:  Well appearing 75 yo woman, NAD HEENT: Unremarkable Neck:  7 cm JVD, no thyromegally Back:  No CVA tenderness Lungs:  Clear with no wheezes, rales, or rhonchi.Well-healed pacemaker insertion site. HEART:  Regular rate rhythm, no murmurs, no rubs, no clicks Abd:  soft, positive bowel sounds, no organomegally, no rebound, no guarding Ext:  2 plus pulses, no edema, no cyanosis, no clubbing Skin:  No rashes no nodules Neuro:  CN II through XII intact, motor grossly intact  DEVICE  Normal device function.  See PaceArt for details.   Assess/Plan: 1. Complete heart block - she is stable, s/p DDD PM insertion 2. Chronic diastolic heart failure - she is exercising and trying to avoid sodium. Her symptoms are class 2. 3. PPM - her medtronic DDD PM is working normally. Will recheck in several months.  Mikle Bosworth.D.

## 2015-12-16 ENCOUNTER — Ambulatory Visit (INDEPENDENT_AMBULATORY_CARE_PROVIDER_SITE_OTHER): Payer: Commercial Managed Care - HMO | Admitting: *Deleted

## 2015-12-16 ENCOUNTER — Telehealth: Payer: Self-pay | Admitting: Cardiology

## 2015-12-16 DIAGNOSIS — I442 Atrioventricular block, complete: Secondary | ICD-10-CM

## 2015-12-16 NOTE — Progress Notes (Signed)
Remote pacemaker transmission.   

## 2015-12-16 NOTE — Telephone Encounter (Signed)
LMOVM reminding pt to send remote transmission.   

## 2015-12-17 LAB — CUP PACEART REMOTE DEVICE CHECK
Battery Remaining Longevity: 57 mo
Brady Statistic AP VS Percent: 0 %
Brady Statistic AS VS Percent: 0 %
Implantable Lead Implant Date: 20130107
Implantable Lead Implant Date: 20130107
Implantable Lead Location: 753859
Implantable Lead Model: 5076
Lead Channel Impedance Value: 515 Ohm
Lead Channel Pacing Threshold Pulse Width: 0.4 ms
Lead Channel Pacing Threshold Pulse Width: 0.4 ms
Lead Channel Setting Pacing Amplitude: 2 V
Lead Channel Setting Pacing Amplitude: 2.5 V
Lead Channel Setting Pacing Pulse Width: 0.64 ms
Lead Channel Setting Sensing Sensitivity: 2 mV
MDC IDC LEAD LOCATION: 753860
MDC IDC MSMT BATTERY IMPEDANCE: 702 Ohm
MDC IDC MSMT BATTERY VOLTAGE: 2.77 V
MDC IDC MSMT LEADCHNL RA IMPEDANCE VALUE: 493 Ohm
MDC IDC MSMT LEADCHNL RA PACING THRESHOLD AMPLITUDE: 1 V
MDC IDC MSMT LEADCHNL RA SENSING INTR AMPL: 2.8 mV
MDC IDC MSMT LEADCHNL RV PACING THRESHOLD AMPLITUDE: 1.5 V
MDC IDC SESS DTM: 20170628172147
MDC IDC STAT BRADY AP VP PERCENT: 64 %
MDC IDC STAT BRADY AS VP PERCENT: 36 %

## 2015-12-18 ENCOUNTER — Encounter: Payer: Self-pay | Admitting: Cardiology

## 2016-01-18 DIAGNOSIS — I10 Essential (primary) hypertension: Secondary | ICD-10-CM | POA: Diagnosis not present

## 2016-01-18 DIAGNOSIS — E119 Type 2 diabetes mellitus without complications: Secondary | ICD-10-CM | POA: Diagnosis not present

## 2016-01-21 DIAGNOSIS — M2550 Pain in unspecified joint: Secondary | ICD-10-CM | POA: Diagnosis not present

## 2016-01-21 DIAGNOSIS — Z950 Presence of cardiac pacemaker: Secondary | ICD-10-CM | POA: Diagnosis not present

## 2016-01-21 DIAGNOSIS — E039 Hypothyroidism, unspecified: Secondary | ICD-10-CM | POA: Diagnosis not present

## 2016-01-21 DIAGNOSIS — E782 Mixed hyperlipidemia: Secondary | ICD-10-CM | POA: Diagnosis not present

## 2016-01-21 DIAGNOSIS — N183 Chronic kidney disease, stage 3 (moderate): Secondary | ICD-10-CM | POA: Diagnosis not present

## 2016-01-21 DIAGNOSIS — M8580 Other specified disorders of bone density and structure, unspecified site: Secondary | ICD-10-CM | POA: Diagnosis not present

## 2016-01-21 DIAGNOSIS — Z0000 Encounter for general adult medical examination without abnormal findings: Secondary | ICD-10-CM | POA: Diagnosis not present

## 2016-01-21 DIAGNOSIS — I10 Essential (primary) hypertension: Secondary | ICD-10-CM | POA: Diagnosis not present

## 2016-01-21 DIAGNOSIS — Z8719 Personal history of other diseases of the digestive system: Secondary | ICD-10-CM | POA: Diagnosis not present

## 2016-01-21 DIAGNOSIS — E119 Type 2 diabetes mellitus without complications: Secondary | ICD-10-CM | POA: Diagnosis not present

## 2016-03-10 DIAGNOSIS — R946 Abnormal results of thyroid function studies: Secondary | ICD-10-CM | POA: Diagnosis not present

## 2016-03-10 DIAGNOSIS — E782 Mixed hyperlipidemia: Secondary | ICD-10-CM | POA: Diagnosis not present

## 2016-03-10 DIAGNOSIS — E119 Type 2 diabetes mellitus without complications: Secondary | ICD-10-CM | POA: Diagnosis not present

## 2016-03-10 DIAGNOSIS — Z23 Encounter for immunization: Secondary | ICD-10-CM | POA: Diagnosis not present

## 2016-03-10 DIAGNOSIS — Z79899 Other long term (current) drug therapy: Secondary | ICD-10-CM | POA: Diagnosis not present

## 2016-03-16 ENCOUNTER — Encounter: Payer: Commercial Managed Care - HMO | Admitting: *Deleted

## 2016-03-16 ENCOUNTER — Telehealth: Payer: Self-pay | Admitting: Cardiology

## 2016-03-16 NOTE — Telephone Encounter (Signed)
LMOVM reminding pt to send remote transmission.   

## 2016-03-18 ENCOUNTER — Encounter: Payer: Self-pay | Admitting: Cardiology

## 2016-03-28 ENCOUNTER — Ambulatory Visit (INDEPENDENT_AMBULATORY_CARE_PROVIDER_SITE_OTHER): Payer: Commercial Managed Care - HMO | Admitting: *Deleted

## 2016-03-28 DIAGNOSIS — I442 Atrioventricular block, complete: Secondary | ICD-10-CM

## 2016-03-29 ENCOUNTER — Encounter: Payer: Self-pay | Admitting: Cardiology

## 2016-03-29 NOTE — Progress Notes (Signed)
Remote pacemaker transmission.   

## 2016-03-30 LAB — CUP PACEART REMOTE DEVICE CHECK
Battery Impedance: 805 Ohm
Battery Remaining Longevity: 54 mo
Battery Voltage: 2.77 V
Brady Statistic AP VP Percent: 58 %
Brady Statistic AS VP Percent: 42 %
Implantable Lead Implant Date: 20130107
Implantable Lead Implant Date: 20130107
Implantable Lead Location: 753859
Implantable Lead Model: 5076
Lead Channel Impedance Value: 501 Ohm
Lead Channel Pacing Threshold Amplitude: 0.875 V
Lead Channel Pacing Threshold Amplitude: 1.375 V
Lead Channel Setting Pacing Amplitude: 2 V
Lead Channel Setting Pacing Amplitude: 2.5 V
Lead Channel Setting Pacing Pulse Width: 0.64 ms
MDC IDC LEAD LOCATION: 753860
MDC IDC MSMT LEADCHNL RA PACING THRESHOLD PULSEWIDTH: 0.4 ms
MDC IDC MSMT LEADCHNL RV IMPEDANCE VALUE: 576 Ohm
MDC IDC MSMT LEADCHNL RV PACING THRESHOLD PULSEWIDTH: 0.4 ms
MDC IDC SESS DTM: 20171009135402
MDC IDC SET LEADCHNL RV SENSING SENSITIVITY: 2 mV
MDC IDC STAT BRADY AP VS PERCENT: 0 %
MDC IDC STAT BRADY AS VS PERCENT: 0 %

## 2016-04-01 DIAGNOSIS — E119 Type 2 diabetes mellitus without complications: Secondary | ICD-10-CM | POA: Diagnosis not present

## 2016-04-01 DIAGNOSIS — H52203 Unspecified astigmatism, bilateral: Secondary | ICD-10-CM | POA: Diagnosis not present

## 2016-04-01 DIAGNOSIS — Z961 Presence of intraocular lens: Secondary | ICD-10-CM | POA: Diagnosis not present

## 2016-06-08 ENCOUNTER — Encounter: Payer: Self-pay | Admitting: Interventional Cardiology

## 2016-06-08 ENCOUNTER — Telehealth: Payer: Self-pay

## 2016-06-08 DIAGNOSIS — R0609 Other forms of dyspnea: Secondary | ICD-10-CM | POA: Diagnosis not present

## 2016-06-08 DIAGNOSIS — I10 Essential (primary) hypertension: Secondary | ICD-10-CM | POA: Diagnosis not present

## 2016-06-08 NOTE — Telephone Encounter (Signed)
SENT NOTES TO SCHEDULING 

## 2016-06-09 NOTE — Progress Notes (Signed)
Patient ID: Gabriela Phillips, female   DOB: 08-20-1940, 75 y.o.   MRN: [id number]     Cardiology Office Note   Date:  06/10/2016   ID:  Gabriela Phillips, DOB 02/10/1941, MRN [id number]  PCP:  Gennette Pac, MD    No chief complaint on file. f/u CHF   Wt Readings from Last 3 Encounters:  06/10/16 148 lb (67.1 kg)  09/16/15 150 lb 6.4 oz (68.2 kg)  09/15/15 150 lb 6.4 oz (68.2 kg)       History of Present Illness: Gabriela Phillips is a 75 y.o. female  who has had a pacer placed. SHe has nonobstructive CAD by cath in 2008. SHe had some chest pain and then a negative stress test in 10/14.   She reports some dyspnea on exertion when going up stairs over the last few weeks. She had been taking high dose of a fiber pill. She then decrease this in her shortness of breath improved somewhat. It is not back to baseline. She is a little nervous to start back exercising. She denies any swelling. She has no trouble lying flat. She saw her primary care doctor who referred her back to [place].  Labs were checked with the primary care doctor. They did not check a BNP. Her feeling is more shortness of breath. No clear pain in her chest.  She feels her abdomen is swollen at times. This is typically where she retains her fluid. She did try taking an extra Lasix pill several days ago. This did give her some improvement.    Past Medical History:  Diagnosis Date  . CAD (coronary artery disease)    a. LHC (2/28): Severe HK at the apex, EF 40%, diffuse luminal irregularities, no significant CAD.;  b.  Lexiscan Myoview (2/13): EF 76%, normal perfusion; low-risk study;  c.  Lexiscan Myoview (10/14):  Low risk, no ischemia, EF 75%.  . Chronic systolic CHF (congestive heart failure) (HCC)    EF previously 40%; NICM; EF recovered;  Echo (03/2010): Normal LV function, EF 68.4%, apical AK, mild MR, mild TR, normal RV systolic pressure, diastolic dysfunction.  . Complete heart block ([place])   . Diabetes mellitus  without complication ([place])   . Hypertension   . Hypothyroidism   . NICM (nonischemic cardiomyopathy) ([place])    non-ischemic; resolved EF 75%; GSPECT  . 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 Pre-diabetes   . Radiculopathy    followed by Dr. Krista Blue    Past Surgical History:  Procedure Laterality Date  . BREAST ENHANCEMENT SURGERY    . DOPPLER ECHOCARDIOGRAPHY  03/2010   with normal left ventricular function, ejection fraction 68%, apical akinesis and mild mitral regurgitation  . PACEMAKER GENERATOR CHANGE N/A 06/27/2011   Procedure: PACEMAKER GENERATOR CHANGE;  Surgeon: Evans Lance, MD;  Location: [facility] CATH LAB;  Service: Cardiovascular;  Laterality: N/A;  . PACEMAKER INSERTION  2005   complete heart block     Current Outpatient Prescriptions  Medication Sig Dispense Refill  . atorvastatin (LIPITOR) 40 MG tablet Take 40 mg by mouth daily.    . carvedilol (COREG) 6.25 MG tablet Take 6.25 mg by mouth 2 (two) times daily with a meal.      . cyanocobalamin 500 MCG tablet Take 500 mcg by mouth daily.     . furosemide (LASIX) 40 MG tablet 1-2 tablets prn for swelling 180 tablet 3  . levothyroxine (SYNTHROID, LEVOTHROID) 50 MCG tablet Take 50 mcg  by mouth every other day. Alternates with Levothyroxine 75 mcg.    . levothyroxine (SYNTHROID, LEVOTHROID) 75 MCG tablet Take 75 mcg by mouth every other day. Alternates with Levothyroxine 50 mcg.    Marland Kitchen LORazepam (ATIVAN) 1 MG tablet Take 1 mg by mouth 3 (three) times daily as needed for anxiety.    Marland Kitchen losartan (COZAAR) 100 MG tablet Take 100 mg by mouth daily.    . nitroGLYCERIN (NITROSTAT) 0.4 MG SL tablet Place 0.4 mg under the tongue every 5 (five) minutes as needed for chest pain. X 3 doses    . Omega-3 Fatty Acids (RA FISH OIL) 900 MG CAPS Take 4 capsules by mouth daily.     . potassium chloride SA (K-DUR,KLOR-CON) 20 MEQ tablet Take 20 mEq by mouth daily.     No current facility-administered medications for this visit.       Allergies:   Penicillins    Social History:  The patient  reports that she quit smoking about 18 years ago. She has never used smokeless tobacco. She reports that she does not drink alcohol or use drugs.   Family History:  The patient's family history includes Diabetes in her father, mother, and sister; Liver cancer in her father.    ROS:  Please see the history of present illness.   Otherwise, review of systems are positive for occasional gas pain.   All other systems are reviewed and negative.    PHYSICAL EXAM: VS:  BP 112/62   Pulse 85   Ht 5' (1.524 m)   Wt 148 lb (67.1 kg)   BMI 28.90 kg/m  , BMI Body mass index is 28.9 kg/m. GEN: Well nourished, well developed, in no acute distress HEENT: normal Neck: no JVD, carotid bruits, or masses Cardiac: RRR; no murmurs, rubs, or gallops,no edema  Respiratory:  clear to auscultation bilaterally, normal work of breathing GI: soft, nontender, nondistended, + BS, mild obesity MS: no deformity or atrophy Skin: warm and dry, no rash Neuro:  Strength and sensation are intact Psych: euthymic mood, full affect      Recent Labs: No results found for requested labs within last 8760 hours.   Lipid Panel No results found for: CHOL, TRIG, HDL, CHOLHDL, VLDL, LDLCALC, LDLDIRECT   Other studies Reviewed: Additional studies/ records that were reviewed today with results demonstrating: Normal EF 68% in 2014 .   ASSESSMENT AND PLAN:  1. Pacer: f/u with Dr. Lovena Le.  2. HTN: Stable.  Continue current meds and regular exercise.  Per report, her labs checked with her primary care physician were within normal limits. 3. Hyperlipidemia: Followed by Dr. Artis Flock have been elevated.   4. Chronic diastolic heart failure/Fluid retention/DOE: Could've been related to her high dose of fiber supplement. Improves when she decreased the dosage.  She adjusts her dose of Lasix at home. We'll have her take Lasix 40 twice a day for the next 2 days.  Will call in a new prescription for her to have 60 Lasix tablets per month. If she takes 2 Lasix in a day, she will also take 2 potassium pills and that day. Given her shortness of breath, will check a BNP. We'll also check echocardiogram to see if there is been any change in her left ventricular function. Depending on her symptoms, we could consider stress testing in a later time.   Current medicines are reviewed at length with the patient today.  The patient concerns regarding her medicines were addressed.  The following changes  have been made:  No change  Labs/ tests ordered today include:  No orders of the defined types were placed in this encounter.   Recommend 150 minutes/week of aerobic exercise Low fat, low carb, high fiber diet recommended  Disposition:   Patient does not like to come to the office to see two different doctors.    She will f/u with Dr. Lovena Le annually.    Signed, Larae Grooms, MD  06/10/2016 9:40 AM    [facility] [address], [place], [place]  [address] Phone: [phone]; Fax: [phone]

## 2016-06-10 ENCOUNTER — Encounter: Payer: Self-pay | Admitting: Interventional Cardiology

## 2016-06-10 ENCOUNTER — Ambulatory Visit (INDEPENDENT_AMBULATORY_CARE_PROVIDER_SITE_OTHER): Payer: Commercial Managed Care - HMO | Admitting: Interventional Cardiology

## 2016-06-10 VITALS — BP 112/62 | HR 85 | Ht 60.0 in | Wt 148.0 lb

## 2016-06-10 DIAGNOSIS — I10 Essential (primary) hypertension: Secondary | ICD-10-CM

## 2016-06-10 DIAGNOSIS — I5032 Chronic diastolic (congestive) heart failure: Secondary | ICD-10-CM | POA: Diagnosis not present

## 2016-06-10 DIAGNOSIS — R0602 Shortness of breath: Secondary | ICD-10-CM

## 2016-06-10 DIAGNOSIS — Z950 Presence of cardiac pacemaker: Secondary | ICD-10-CM

## 2016-06-10 DIAGNOSIS — R0609 Other forms of dyspnea: Secondary | ICD-10-CM

## 2016-06-10 LAB — BRAIN NATRIURETIC PEPTIDE: Brain Natriuretic Peptide: 43.8 pg/mL (ref <100)

## 2016-06-10 MED ORDER — FUROSEMIDE 40 MG PO TABS: 40.0000 mg | ORAL_TABLET | Freq: Two times a day (BID) | ORAL | 3 refills | Status: DC | PRN

## 2016-06-10 MED ORDER — POTASSIUM CHLORIDE CRYS ER 20 MEQ PO TBCR: 20.0000 meq | EXTENDED_RELEASE_TABLET | Freq: Two times a day (BID) | ORAL | 3 refills | Status: DC | PRN

## 2016-06-10 NOTE — Patient Instructions (Signed)
Medication Instructions:  The current medical regimen is effective;  continue present plan and medications.  Labwork: Please have lab work today (BNP)  Testing/Procedures: Your physician has requested that you have an echocardiogram. Echocardiography is a painless test that uses sound waves to create images of your heart. It provides your doctor with information about the size and shape of your heart and how well your heart's chambers and valves are working. This procedure takes approximately one hour. There are no restrictions for this procedure.  Follow-Up: Follow up in 6 months with Dr. Irish Lack.  You will receive a letter in the mail 2 months before you are due.  Please call us when you receive this letter to schedule your follow up appointment.  If you need a refill on your cardiac medications before your next appointment, please call your pharmacy.  Thank you for choosing [facility]!!

## 2016-06-27 ENCOUNTER — Ambulatory Visit (INDEPENDENT_AMBULATORY_CARE_PROVIDER_SITE_OTHER): Payer: Medicare HMO | Admitting: *Deleted

## 2016-06-27 ENCOUNTER — Telehealth: Payer: Self-pay | Admitting: Cardiology

## 2016-06-27 DIAGNOSIS — I442 Atrioventricular block, complete: Secondary | ICD-10-CM | POA: Diagnosis not present

## 2016-06-27 NOTE — Telephone Encounter (Signed)
Confirmed remote transmission w/ pt husband.   

## 2016-06-28 NOTE — Progress Notes (Signed)
Remote pacemaker transmission.   

## 2016-06-29 ENCOUNTER — Encounter: Payer: Self-pay | Admitting: Cardiology

## 2016-06-30 LAB — CUP PACEART REMOTE DEVICE CHECK
Battery Impedance: 964 Ohm
Battery Remaining Longevity: 50 mo
Battery Voltage: 2.78 V
Brady Statistic AP VP Percent: 51 %
Brady Statistic AP VS Percent: 0 %
Brady Statistic AS VP Percent: 49 %
Implantable Lead Implant Date: 20130107
Implantable Lead Location: 753859
Implantable Lead Model: 5092
Implantable Pulse Generator Implant Date: 20130107
Lead Channel Impedance Value: 487 Ohm
Lead Channel Pacing Threshold Amplitude: 1 V
Lead Channel Setting Pacing Amplitude: 2 V
Lead Channel Setting Pacing Amplitude: 2.5 V
Lead Channel Setting Pacing Pulse Width: 0.64 ms
MDC IDC LEAD IMPLANT DT: 20130107
MDC IDC LEAD LOCATION: 753860
MDC IDC MSMT LEADCHNL RA PACING THRESHOLD PULSEWIDTH: 0.4 ms
MDC IDC MSMT LEADCHNL RV IMPEDANCE VALUE: 644 Ohm
MDC IDC MSMT LEADCHNL RV PACING THRESHOLD AMPLITUDE: 1.375 V
MDC IDC MSMT LEADCHNL RV PACING THRESHOLD PULSEWIDTH: 0.4 ms
MDC IDC SESS DTM: 20180108194918
MDC IDC SET LEADCHNL RV SENSING SENSITIVITY: 2 mV
MDC IDC STAT BRADY AS VS PERCENT: 0 %

## 2016-07-06 ENCOUNTER — Other Ambulatory Visit (HOSPITAL_COMMUNITY): Payer: Commercial Managed Care - HMO

## 2016-07-25 ENCOUNTER — Ambulatory Visit (HOSPITAL_COMMUNITY): Payer: Medicare HMO | Attending: Cardiology

## 2016-07-25 ENCOUNTER — Other Ambulatory Visit: Payer: Self-pay

## 2016-07-25 DIAGNOSIS — I110 Hypertensive heart disease with heart failure: Secondary | ICD-10-CM | POA: Diagnosis not present

## 2016-07-25 DIAGNOSIS — I5032 Chronic diastolic (congestive) heart failure: Secondary | ICD-10-CM | POA: Insufficient documentation

## 2016-07-25 DIAGNOSIS — R29898 Other symptoms and signs involving the musculoskeletal system: Secondary | ICD-10-CM | POA: Insufficient documentation

## 2016-07-25 DIAGNOSIS — I340 Nonrheumatic mitral (valve) insufficiency: Secondary | ICD-10-CM | POA: Diagnosis not present

## 2016-07-25 DIAGNOSIS — I429 Cardiomyopathy, unspecified: Secondary | ICD-10-CM | POA: Insufficient documentation

## 2016-09-14 ENCOUNTER — Ambulatory Visit (INDEPENDENT_AMBULATORY_CARE_PROVIDER_SITE_OTHER): Payer: Medicare HMO | Admitting: Internal Medicine

## 2016-09-14 ENCOUNTER — Encounter (INDEPENDENT_AMBULATORY_CARE_PROVIDER_SITE_OTHER): Payer: Self-pay

## 2016-09-14 ENCOUNTER — Encounter: Payer: Self-pay | Admitting: Internal Medicine

## 2016-09-14 VITALS — BP 92/60 | HR 87 | Ht 60.0 in | Wt 150.0 lb

## 2016-09-14 DIAGNOSIS — Z950 Presence of cardiac pacemaker: Secondary | ICD-10-CM

## 2016-09-14 DIAGNOSIS — I442 Atrioventricular block, complete: Secondary | ICD-10-CM | POA: Diagnosis not present

## 2016-09-14 LAB — CUP PACEART INCLINIC DEVICE CHECK
Battery Impedance: 1015 Ohm
Battery Voltage: 2.77 V
Brady Statistic AP VP Percent: 50 %
Brady Statistic AP VS Percent: 0 %
Brady Statistic AS VP Percent: 50 %
Brady Statistic AS VS Percent: 0 %
Date Time Interrogation Session: 20180328124206
Implantable Lead Implant Date: 20130107
Implantable Lead Location: 753860
Implantable Lead Model: 5092
Lead Channel Pacing Threshold Amplitude: 0.875 V
Lead Channel Pacing Threshold Pulse Width: 0.4 ms
Lead Channel Pacing Threshold Pulse Width: 0.4 ms
Lead Channel Sensing Intrinsic Amplitude: 2 mV
Lead Channel Sensing Intrinsic Amplitude: 4 mV
Lead Channel Setting Pacing Amplitude: 2 V
MDC IDC LEAD IMPLANT DT: 20130107
MDC IDC LEAD LOCATION: 753859
MDC IDC MSMT BATTERY REMAINING LONGEVITY: 47 mo
MDC IDC MSMT LEADCHNL RA IMPEDANCE VALUE: 480 Ohm
MDC IDC MSMT LEADCHNL RA PACING THRESHOLD AMPLITUDE: 0.75 V
MDC IDC MSMT LEADCHNL RA PACING THRESHOLD PULSEWIDTH: 0.4 ms
MDC IDC MSMT LEADCHNL RV IMPEDANCE VALUE: 541 Ohm
MDC IDC MSMT LEADCHNL RV PACING THRESHOLD AMPLITUDE: 1 V
MDC IDC MSMT LEADCHNL RV PACING THRESHOLD AMPLITUDE: 1.375 V
MDC IDC MSMT LEADCHNL RV PACING THRESHOLD PULSEWIDTH: 0.64 ms
MDC IDC PG IMPLANT DT: 20130107
MDC IDC SET LEADCHNL RV PACING AMPLITUDE: 2.5 V
MDC IDC SET LEADCHNL RV PACING PULSEWIDTH: 0.64 ms
MDC IDC SET LEADCHNL RV SENSING SENSITIVITY: 2 mV

## 2016-09-14 NOTE — Patient Instructions (Signed)
Your physician recommends that you continue on your current medications as directed. Please refer to the Current Medication list given to you today.  Remote monitoring is used to monitor your Pacemaker of ICD from home. This monitoring reduces the number of office visits required to check your device to one time per year. It allows [place] to keep an eye on the functioning of your device to ensure it is working properly. You are scheduled for a device check from home on 12/14/16. You may send your transmission at any time that day. If you have a wireless device, the transmission will be sent automatically. After your physician reviews your transmission, you will receive a postcard with your next transmission date.  Your physician wants you to follow-up in: 12 months with Dr. Lovena Le.  You will receive a reminder letter in the mail two months in advance. If you don't receive a letter, please call our office to schedule the follow-up appointment.

## 2016-09-14 NOTE — Progress Notes (Signed)
HPI  Gabriela Phillips returns today for followup. She is a very pleasant 76 year old woman with a history of complete heart block, hypertension, status post permanent pacemaker insertion. In the interim, she has done well. She has class II heart failure symptoms which have been well compensated. The patient has a prior history of left ventricular dysfunction, with normalization of her ejection fraction in the past. She denies syncope.  Allergies  Allergen Reactions  . Penicillins Other (See Comments)    Dark stool     Current Outpatient Prescriptions  Medication Sig Dispense Refill  . atorvastatin (LIPITOR) 40 MG tablet Take 40 mg by mouth daily.    . carvedilol (COREG) 6.25 MG tablet Take 6.25 mg by mouth 2 (two) times daily with a meal.      . cyanocobalamin 500 MCG tablet Take 500 mcg by mouth daily.     . furosemide (LASIX) 40 MG tablet Take 1 tablet (40 mg total) by mouth 2 (two) times daily as needed. 180 tablet 3  . levothyroxine (SYNTHROID, LEVOTHROID) 75 MCG tablet Take 75 mcg by mouth daily. Alternates with Levothyroxine 50 mcg.     Marland Kitchen LORazepam (ATIVAN) 1 MG tablet Take 1 mg by mouth 3 (three) times daily as needed for anxiety.    Marland Kitchen losartan (COZAAR) 100 MG tablet Take 100 mg by mouth daily.    . nitroGLYCERIN (NITROSTAT) 0.4 MG SL tablet Place 0.4 mg under the tongue every 5 (five) minutes as needed for chest pain. X 3 doses    . Omega-3 Fatty Acids (RA FISH OIL) 900 MG CAPS Take 4 capsules by mouth daily.     . potassium chloride SA (K-DUR,KLOR-CON) 20 MEQ tablet Take 1 tablet (20 mEq total) by mouth 2 (two) times daily as needed. 180 tablet 3   No current facility-administered medications for this visit.      Past Medical History:  Diagnosis Date  . CAD (coronary artery disease)    a. LHC (2/28): Severe HK at the apex, EF 40%, diffuse luminal irregularities, no significant CAD.;  b.  Lexiscan Myoview (2/13): EF 76%, normal perfusion; low-risk study;  c.  Lexiscan  Myoview (10/14):  Low risk, no ischemia, EF 75%.  . Chronic systolic CHF (congestive heart failure) (HCC)    EF previously 40%; NICM; EF recovered;  Echo (03/2010): Normal LV function, EF 68.4%, apical AK, mild MR, mild TR, normal RV systolic pressure, diastolic dysfunction.  . Complete heart block ([place])   . Diabetes mellitus without complication ([place])   . Hypertension   . Hypothyroidism   . NICM (nonischemic cardiomyopathy) ([place])    non-ischemic; resolved EF 75%; GSPECT  . 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 Pre-diabetes   . Radiculopathy    followed by Dr. Krista Phillips    ROS:   All systems reviewed and negative except as noted in the HPI.   Past Surgical History:  Procedure Laterality Date  . BREAST ENHANCEMENT SURGERY    . DOPPLER ECHOCARDIOGRAPHY  03/2010   with normal left ventricular function, ejection fraction 68%, apical akinesis and mild mitral regurgitation  . PACEMAKER GENERATOR CHANGE N/A 06/27/2011   Procedure: PACEMAKER GENERATOR CHANGE;  Surgeon: Evans Lance, MD;  Location[facility] CATH LAB;  Service: Cardiovascular;  Laterality: N/A;  . PACEMAKER INSERTION  2005   complete heart block     Family History  Problem Relation Age of Onset  . Diabetes Mother   . Liver cancer  Father   . Diabetes Father   . Diabetes Sister   . Heart attack Neg Hx   . Stroke Neg Hx   . Hypertension Neg Hx      Social History   Social History  . Marital status: Married    Spouse name: N/A  . Number of children: N/A  . Years of education: N/A   Occupational History  . Not on file.   Social History Main Topics  . Smoking status: Former Smoker    Quit date: 06/23/1997  . Smokeless tobacco: Never Used  . Alcohol use No  . Drug use: No  . Sexual activity: Not on file   Other Topics Concern  . Not on file   Social History Narrative  . No narrative on file     BP 92/60   Pulse 87   Ht 5' (1.524 m)   Wt 150 lb (68 kg)   SpO2 95%   BMI 29.29 kg/m    Physical Exam:  Well appearing 76 yo woman, NAD HEENT: Unremarkable Neck:  7 cm JVD, no thyromegally Back:  No CVA tenderness Lungs:  Clear with no wheezes, rales, or rhonchi.Well-healed pacemaker insertion site. HEART:  Regular rate rhythm, no murmurs, no rubs, no clicks Abd:  soft, positive bowel sounds, no organomegally, no rebound, no guarding Ext:  2 plus pulses, no edema, no cyanosis, no clubbing Skin:  No rashes no nodules Neuro:  CN II through XII intact, motor grossly intact  DEVICE  Normal device function.  See PaceArt for details.   Assess/Plan: 1. Complete heart block - she is stable, s/p DDD PM insertion 2. Chronic diastolic heart failure - she is exercising and trying to avoid sodium. Her symptoms are class 2. 3. PPM - her medtronic DDD PM is working normally. Will recheck in several months.  Gabriela Phillips.D.

## 2016-09-30 DIAGNOSIS — J0100 Acute maxillary sinusitis, unspecified: Secondary | ICD-10-CM | POA: Diagnosis not present

## 2016-11-16 DIAGNOSIS — E119 Type 2 diabetes mellitus without complications: Secondary | ICD-10-CM | POA: Diagnosis not present

## 2016-11-16 DIAGNOSIS — I10 Essential (primary) hypertension: Secondary | ICD-10-CM | POA: Diagnosis not present

## 2016-11-16 DIAGNOSIS — R5383 Other fatigue: Secondary | ICD-10-CM | POA: Diagnosis not present

## 2016-12-14 ENCOUNTER — Telehealth: Payer: Self-pay | Admitting: Cardiology

## 2016-12-14 ENCOUNTER — Encounter: Payer: Medicare HMO | Admitting: *Deleted

## 2016-12-14 NOTE — Telephone Encounter (Signed)
Attempted to confirm remote transmission with pt. No answer and was unable to leave a message.   

## 2017-03-10 DIAGNOSIS — Z1231 Encounter for screening mammogram for malignant neoplasm of breast: Secondary | ICD-10-CM | POA: Diagnosis not present

## 2017-03-17 DIAGNOSIS — N898 Other specified noninflammatory disorders of vagina: Secondary | ICD-10-CM | POA: Diagnosis not present

## 2017-03-17 DIAGNOSIS — Z23 Encounter for immunization: Secondary | ICD-10-CM | POA: Diagnosis not present

## 2017-03-17 DIAGNOSIS — E669 Obesity, unspecified: Secondary | ICD-10-CM | POA: Diagnosis not present

## 2017-03-17 DIAGNOSIS — Z6830 Body mass index (BMI) 30.0-30.9, adult: Secondary | ICD-10-CM | POA: Diagnosis not present

## 2017-03-29 DIAGNOSIS — R69 Illness, unspecified: Secondary | ICD-10-CM | POA: Diagnosis not present

## 2017-04-03 DIAGNOSIS — H52203 Unspecified astigmatism, bilateral: Secondary | ICD-10-CM | POA: Diagnosis not present

## 2017-04-03 DIAGNOSIS — E119 Type 2 diabetes mellitus without complications: Secondary | ICD-10-CM | POA: Diagnosis not present

## 2017-04-03 DIAGNOSIS — Z961 Presence of intraocular lens: Secondary | ICD-10-CM | POA: Diagnosis not present

## 2017-04-14 ENCOUNTER — Ambulatory Visit (INDEPENDENT_AMBULATORY_CARE_PROVIDER_SITE_OTHER): Payer: Medicare HMO | Admitting: *Deleted

## 2017-04-14 DIAGNOSIS — I442 Atrioventricular block, complete: Secondary | ICD-10-CM

## 2017-04-17 NOTE — Progress Notes (Signed)
Remote pacemaker transmission.   

## 2017-04-21 ENCOUNTER — Encounter: Payer: Self-pay | Admitting: Cardiology

## 2017-04-25 LAB — CUP PACEART REMOTE DEVICE CHECK
Battery Impedance: 1400 Ohm
Battery Remaining Longevity: 38 mo
Brady Statistic AP VS Percent: 0 %
Brady Statistic AS VP Percent: 62 %
Brady Statistic AS VS Percent: 0 %
Date Time Interrogation Session: 20181026165651
Implantable Lead Implant Date: 20130107
Implantable Lead Location: 753860
Implantable Lead Model: 5076
Lead Channel Impedance Value: 570 Ohm
Lead Channel Pacing Threshold Pulse Width: 0.4 ms
Lead Channel Pacing Threshold Pulse Width: 0.4 ms
MDC IDC LEAD IMPLANT DT: 20130107
MDC IDC LEAD LOCATION: 753859
MDC IDC MSMT BATTERY VOLTAGE: 2.76 V
MDC IDC MSMT LEADCHNL RA IMPEDANCE VALUE: 473 Ohm
MDC IDC MSMT LEADCHNL RA PACING THRESHOLD AMPLITUDE: 0.875 V
MDC IDC MSMT LEADCHNL RV PACING THRESHOLD AMPLITUDE: 1.125 V
MDC IDC PG IMPLANT DT: 20130107
MDC IDC SET LEADCHNL RA PACING AMPLITUDE: 2 V
MDC IDC SET LEADCHNL RV PACING AMPLITUDE: 2.5 V
MDC IDC SET LEADCHNL RV PACING PULSEWIDTH: 0.64 ms
MDC IDC SET LEADCHNL RV SENSING SENSITIVITY: 2 mV
MDC IDC STAT BRADY AP VP PERCENT: 38 %

## 2017-04-28 DIAGNOSIS — I10 Essential (primary) hypertension: Secondary | ICD-10-CM | POA: Diagnosis not present

## 2017-04-28 DIAGNOSIS — M85851 Other specified disorders of bone density and structure, right thigh: Secondary | ICD-10-CM | POA: Diagnosis not present

## 2017-04-28 DIAGNOSIS — E119 Type 2 diabetes mellitus without complications: Secondary | ICD-10-CM | POA: Diagnosis not present

## 2017-05-04 DIAGNOSIS — R8290 Unspecified abnormal findings in urine: Secondary | ICD-10-CM | POA: Diagnosis not present

## 2017-05-04 DIAGNOSIS — F419 Anxiety disorder, unspecified: Secondary | ICD-10-CM | POA: Diagnosis not present

## 2017-05-04 DIAGNOSIS — E785 Hyperlipidemia, unspecified: Secondary | ICD-10-CM | POA: Diagnosis not present

## 2017-05-04 DIAGNOSIS — Z0000 Encounter for general adult medical examination without abnormal findings: Secondary | ICD-10-CM | POA: Diagnosis not present

## 2017-05-04 DIAGNOSIS — E039 Hypothyroidism, unspecified: Secondary | ICD-10-CM | POA: Diagnosis not present

## 2017-05-04 DIAGNOSIS — N183 Chronic kidney disease, stage 3 (moderate): Secondary | ICD-10-CM | POA: Diagnosis not present

## 2017-05-04 DIAGNOSIS — Z950 Presence of cardiac pacemaker: Secondary | ICD-10-CM | POA: Diagnosis not present

## 2017-05-04 DIAGNOSIS — M8580 Other specified disorders of bone density and structure, unspecified site: Secondary | ICD-10-CM | POA: Diagnosis not present

## 2017-05-04 DIAGNOSIS — E119 Type 2 diabetes mellitus without complications: Secondary | ICD-10-CM | POA: Diagnosis not present

## 2017-05-04 DIAGNOSIS — I10 Essential (primary) hypertension: Secondary | ICD-10-CM | POA: Diagnosis not present

## 2017-05-08 DIAGNOSIS — M8589 Other specified disorders of bone density and structure, multiple sites: Secondary | ICD-10-CM | POA: Diagnosis not present

## 2017-05-30 ENCOUNTER — Ambulatory Visit: Payer: Medicare HMO | Admitting: Interventional Cardiology

## 2017-05-31 DIAGNOSIS — R9430 Abnormal result of cardiovascular function study, unspecified: Secondary | ICD-10-CM | POA: Insufficient documentation

## 2017-05-31 NOTE — Progress Notes (Signed)
Cardiology Office Note   Date:  06/01/2017   ID:  [name], DOB [birth year], MRN [id number]  PCP:  Hulan Fess, MD    No chief complaint on file.  F/u CHF; DOE  Wt Readings from Last 3 Encounters:  06/01/17 149 lb 9.6 oz (67.9 kg)  09/14/16 150 lb (68 kg)  06/10/16 148 lb (67.1 kg)       History of Present Illness: Gabriela Phillips is a 76 y.o. female  who has had a pacer placed. SHe has nonobstructive CAD by cath in 2008. SHe had some chest pain and then a negative stress test in 10/14.   Overall, she feels well.  She remains active.  She tries to eat healthy and watch portion sizes.  Denies : Chest pain. Dizziness. Leg edema. Nitroglycerin use. Orthopnea. Palpitations. Paroxysmal nocturnal dyspnea. Shortness of breath. Syncope.      Past Medical History:  Diagnosis Date  . Anemia 2016  . CAD (coronary artery disease)    a. LHC (2/28): Severe HK at the apex, EF 40%, diffuse luminal irregularities, no significant CAD.;  b.  Lexiscan Myoview (2/13): EF 76%, normal perfusion; low-risk study;  c.  Lexiscan Myoview (10/14):  Low risk, no ischemia, EF 75%.  . Chronic systolic CHF (congestive heart failure) (HCC)    EF previously 40%; NICM; EF recovered;  Echo (03/2010): Normal LV function, EF 68.4%, apical AK, mild MR, mild TR, normal RV systolic pressure, diastolic dysfunction.  . CKD (chronic kidney disease), stage III ([place])   . Complete heart block ([place])   . Diabetes mellitus without complication ([place])   . Ejection fraction < 50%    68%  . Fatty liver   . Gastritis   . Hypertension   . Hypothyroidism   . Mild mitral regurgitation   . NICM (nonischemic cardiomyopathy) ([place])    non-ischemic; resolved EF 75%; GSPECT  . 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 Pre-diabetes   . Radiculopathy    followed by Dr. Krista Blue  . Syncope     Past Surgical History:  Procedure Laterality Date  . BREAST ENHANCEMENT SURGERY    . CATARACT  EXTRACTION    . DOPPLER ECHOCARDIOGRAPHY  03/2010   with normal left ventricular function, ejection fraction 68%, apical akinesis and mild mitral regurgitation  . ESOPHAGOGASTRODUODENOSCOPY    . INTRAOCULAR LENS INSERTION    . PACEMAKER GENERATOR CHANGE N/A 06/27/2011   Procedure: PACEMAKER GENERATOR CHANGE;  Surgeon: Evans Lance, MD;  Location: [facility] CATH LAB;  Service: Cardiovascular;  Laterality: N/A;  . PACEMAKER INSERTION  2005   complete heart block  . ROOT CANAL    . TOTAL ABDOMINAL HYSTERECTOMY W/ BILATERAL SALPINGOOPHORECTOMY  1989   X2  . UPPER GI ENDOSCOPY       Current Outpatient Medications  Medication Sig Dispense Refill  . atorvastatin (LIPITOR) 40 MG tablet Take 40 mg by mouth daily.    . carvedilol (COREG) 6.25 MG tablet Take 6.25 mg by mouth 2 (two) times daily with a meal.      . Cranberry 450 MG CAPS Take 1 capsule by mouth daily.    . cyanocobalamin 500 MCG tablet Take 500 mcg by mouth daily.     . furosemide (LASIX) 40 MG tablet Take 1 tablet (40 mg total) by mouth 2 (two) times daily as needed. 180 tablet 3  . gabapentin (NEURONTIN) 100 MG capsule Take 100-200 mg by mouth 3 (three) times daily.    Marland Kitchen  levothyroxine (SYNTHROID, LEVOTHROID) 75 MCG tablet Take 75 mcg by mouth daily. Alternates with Levothyroxine 50 mcg.     Marland Kitchen LORazepam (ATIVAN) 1 MG tablet Take 1 mg by mouth 3 (three) times daily as needed for anxiety.    Marland Kitchen losartan (COZAAR) 100 MG tablet Take 100 mg by mouth daily.    . nitroGLYCERIN (NITROSTAT) 0.4 MG SL tablet Place 0.4 mg under the tongue every 5 (five) minutes as needed for chest pain. X 3 doses    . Omega-3 Fatty Acids (RA FISH OIL) 900 MG CAPS Take 4 capsules by mouth daily.     . potassium chloride SA (K-DUR,KLOR-CON) 20 MEQ tablet Take 1 tablet (20 mEq total) by mouth 2 (two) times daily as needed. 180 tablet 3   No current facility-administered medications for this visit.     Allergies:   Crestor [rosuvastatin calcium]; Lipitor  [atorvastatin]; Nsaids; and Penicillins    Social History:  The patient  reports that she quit smoking about 19 years ago. she has never used smokeless tobacco. She reports that she does not drink alcohol or use drugs.   Family History:  The patient's family history includes Alcoholism in her father; Anemia in her mother; Diabetes in her father, mother, and sister; Heart Problems in her mother; Liver cancer in her father.    ROS:  Please see the history of present illness.   Otherwise, review of systems are positive for occasional aches and pains.   All other systems are reviewed and negative.    PHYSICAL EXAM: VS:  BP 112/70   Pulse 70   Ht 5' (1.524 m)   Wt 149 lb 9.6 oz (67.9 kg)   SpO2 95%   BMI 29.22 kg/m  , BMI Body mass index is 29.22 kg/m. GEN: Well nourished, well developed, in no acute distress  HEENT: normal  Neck: no JVD, carotid bruits, or masses Cardiac: RRR; no murmurs, rubs, or gallops,no edema  Respiratory:  clear to auscultation bilaterally, normal work of breathing GI: soft, nontender, nondistended, + BS MS: no deformity or atrophy  Skin: warm and dry, no rash Neuro:  Strength and sensation are intact Psych: euthymic mood, full affect   EKG:   The ekg ordered today demonstrates AV paced rhythm   Recent Labs: 06/10/2016: Brain Natriuretic Peptide 43.8   Lipid Panel No results found for: CHOL, TRIG, HDL, CHOLHDL, VLDL, LDLCALC, LDLDIRECT   Other studies Reviewed: Additional studies/ records that were reviewed today with results demonstrating: lipids reviewed.   ASSESSMENT AND PLAN:  1. Chronic diastolic heart failure/DOE: Sx resolved.   2. HTN: Controlled.  Continue current meds.  3. Pacer: Continue regular pacer checks.  Pacer appears to be functioning well. 4. Hyperlipidemia: Continue atorvastatin.  Lipids well controlled.    Current medicines are reviewed at length with the patient today.  The patient concerns regarding her medicines were  addressed.  The following changes have been made:  No change  Labs/ tests ordered today include:  No orders of the defined types were placed in this encounter.   Recommend 150 minutes/week of aerobic exercise Low fat, low carb, high fiber diet recommended  Disposition:   FU in 1 year   Signed, Larae Grooms, MD  06/01/2017 4:40 PM    [facility] [place], [place], [place]  [address] Phone: [phone]; Fax: [phone]

## 2017-06-01 ENCOUNTER — Ambulatory Visit: Payer: Medicare HMO | Admitting: Interventional Cardiology

## 2017-06-01 ENCOUNTER — Encounter: Payer: Self-pay | Admitting: Interventional Cardiology

## 2017-06-01 VITALS — BP 112/70 | HR 70 | Ht 60.0 in | Wt 149.6 lb

## 2017-06-01 DIAGNOSIS — I10 Essential (primary) hypertension: Secondary | ICD-10-CM | POA: Diagnosis not present

## 2017-06-01 DIAGNOSIS — E782 Mixed hyperlipidemia: Secondary | ICD-10-CM | POA: Diagnosis not present

## 2017-06-01 DIAGNOSIS — Z950 Presence of cardiac pacemaker: Secondary | ICD-10-CM

## 2017-06-01 DIAGNOSIS — R0602 Shortness of breath: Secondary | ICD-10-CM

## 2017-06-01 DIAGNOSIS — I5032 Chronic diastolic (congestive) heart failure: Secondary | ICD-10-CM

## 2017-06-01 NOTE — Patient Instructions (Signed)

## 2017-06-05 DIAGNOSIS — I5032 Chronic diastolic (congestive) heart failure: Secondary | ICD-10-CM | POA: Insufficient documentation

## 2017-07-17 ENCOUNTER — Ambulatory Visit (INDEPENDENT_AMBULATORY_CARE_PROVIDER_SITE_OTHER): Payer: Medicare HMO | Admitting: *Deleted

## 2017-07-17 DIAGNOSIS — I442 Atrioventricular block, complete: Secondary | ICD-10-CM

## 2017-07-17 NOTE — Progress Notes (Signed)
Remote pacemaker transmission.   

## 2017-07-18 LAB — CUP PACEART REMOTE DEVICE CHECK
Battery Impedance: 1513 Ohm
Battery Remaining Longevity: 36 mo
Brady Statistic AP VP Percent: 37 %
Implantable Lead Implant Date: 20130107
Implantable Lead Location: 753859
Implantable Lead Model: 5076
Implantable Lead Model: 5092
Implantable Pulse Generator Implant Date: 20130107
Lead Channel Impedance Value: 487 Ohm
Lead Channel Impedance Value: 605 Ohm
Lead Channel Sensing Intrinsic Amplitude: 2.8 mV
Lead Channel Setting Pacing Amplitude: 2 V
Lead Channel Setting Pacing Amplitude: 2.5 V
Lead Channel Setting Pacing Pulse Width: 0.64 ms
MDC IDC LEAD IMPLANT DT: 20130107
MDC IDC LEAD LOCATION: 753860
MDC IDC MSMT BATTERY VOLTAGE: 2.75 V
MDC IDC MSMT LEADCHNL RA PACING THRESHOLD AMPLITUDE: 0.875 V
MDC IDC MSMT LEADCHNL RA PACING THRESHOLD PULSEWIDTH: 0.4 ms
MDC IDC MSMT LEADCHNL RV PACING THRESHOLD AMPLITUDE: 1.125 V
MDC IDC MSMT LEADCHNL RV PACING THRESHOLD PULSEWIDTH: 0.4 ms
MDC IDC SESS DTM: 20190128145104
MDC IDC SET LEADCHNL RV SENSING SENSITIVITY: 2 mV
MDC IDC STAT BRADY AP VS PERCENT: 0 %
MDC IDC STAT BRADY AS VP PERCENT: 62 %
MDC IDC STAT BRADY AS VS PERCENT: 0 %

## 2017-07-19 ENCOUNTER — Encounter: Payer: Self-pay | Admitting: Cardiology

## 2017-09-07 DIAGNOSIS — M25512 Pain in left shoulder: Secondary | ICD-10-CM | POA: Diagnosis not present

## 2017-09-20 DIAGNOSIS — M25512 Pain in left shoulder: Secondary | ICD-10-CM | POA: Diagnosis not present

## 2017-10-04 ENCOUNTER — Encounter: Payer: Self-pay | Admitting: Internal Medicine

## 2017-10-04 ENCOUNTER — Ambulatory Visit: Payer: Medicare HMO | Admitting: Internal Medicine

## 2017-10-04 VITALS — BP 118/58 | HR 60 | Ht 60.0 in | Wt 149.0 lb

## 2017-10-04 DIAGNOSIS — Z950 Presence of cardiac pacemaker: Secondary | ICD-10-CM

## 2017-10-04 DIAGNOSIS — I442 Atrioventricular block, complete: Secondary | ICD-10-CM | POA: Diagnosis not present

## 2017-10-04 DIAGNOSIS — I5032 Chronic diastolic (congestive) heart failure: Secondary | ICD-10-CM

## 2017-10-04 NOTE — Patient Instructions (Signed)
Medication Instructions:  Your physician recommends that you continue on your current medications as directed. Please refer to the Current Medication list given to you today.  Labwork: None ordered.  Testing/Procedures: None ordered.  Follow-Up: Your physician wants you to follow-up in: one year with Dr. Lovena Le.   You will receive a reminder letter in the mail two months in advance. If you don't receive a letter, please call our office to schedule the follow-up appointment.  Remote monitoring is used to monitor your Pacemaker from home. This monitoring reduces the number of office visits required to check your device to one time per year. It allows [place] to keep an eye on the functioning of your device to ensure it is working properly. You are scheduled for a device check from home on 10/16/2017. You may send your transmission at any time that day. If you have a wireless device, the transmission will be sent automatically. After your physician reviews your transmission, you will receive a postcard with your next transmission date.  Any Other Special Instructions Will Be Listed Below (If Applicable).  If you need a refill on your cardiac medications before your next appointment, please call your pharmacy.

## 2017-10-04 NOTE — Progress Notes (Signed)
HPI Gabriela Phillips returns today for followup. She is a very pleasant 77 year old woman with a history of complete heart block, hypertension, status post permanent pacemaker insertion. In the interim, she has done well. She has class II heart failure symptoms which have been well compensated. The patient has a prior history of left ventricular dysfunction, with normalization of her ejection fraction in the past. She denies syncope. She has had some problems with arthritis.  Allergies  Allergen Reactions  . Crestor [Rosuvastatin Calcium]     CHEST TIGHTNESS, SOB  . Nsaids   . Lipitor [Atorvastatin]     MUSCLE ACHES  . Penicillins Other (See Comments)    Dark stool     Current Outpatient Medications  Medication Sig Dispense Refill  . atorvastatin (LIPITOR) 40 MG tablet Take 40 mg by mouth daily.    . carvedilol (COREG) 6.25 MG tablet Take 6.25 mg by mouth 2 (two) times daily with a meal.      . Cranberry 450 MG CAPS Take 1 capsule by mouth daily.    . cyanocobalamin 500 MCG tablet Take 500 mcg by mouth daily.     . furosemide (LASIX) 40 MG tablet 40 mg. Take one tablet by mouth and if needed take one additional tablet    . gabapentin (NEURONTIN) 100 MG capsule Take 100-200 mg by mouth 3 (three) times daily.    Marland Kitchen levothyroxine (SYNTHROID, LEVOTHROID) 75 MCG tablet Take 75 mcg by mouth daily. Alternates with Levothyroxine 50 mcg.     Marland Kitchen LORazepam (ATIVAN) 1 MG tablet Take 1 mg by mouth 3 (three) times daily as needed for anxiety.    Marland Kitchen losartan (COZAAR) 100 MG tablet Take 100 mg by mouth daily.    . Omega-3 Fatty Acids (RA FISH OIL) 900 MG CAPS Take 4 capsules by mouth daily.     . potassium chloride (KLOR-CON) 20 MEQ packet 20 mEq. Take one tablet by mouth and if needed take one additional tablet     No current facility-administered medications for this visit.      Past Medical History:  Diagnosis Date  . Anemia 2016  . CAD (coronary artery disease)    a. LHC (2/28): Severe  HK at the apex, EF 40%, diffuse luminal irregularities, no significant CAD.;  b.  Lexiscan Myoview (2/13): EF 76%, normal perfusion; low-risk study;  c.  Lexiscan Myoview (10/14):  Low risk, no ischemia, EF 75%.  . Chronic systolic CHF (congestive heart failure) (HCC)    EF previously 40%; NICM; EF recovered;  Echo (03/2010): Normal LV function, EF 68.4%, apical AK, mild MR, mild TR, normal RV systolic pressure, diastolic dysfunction.  . CKD (chronic kidney disease), stage III ([place])   . Complete heart block ([place])   . Diabetes mellitus without complication ([place])   . Ejection fraction < 50%    68%  . Fatty liver   . Gastritis   . Hypertension   . Hypothyroidism   . Mild mitral regurgitation   . NICM (nonischemic cardiomyopathy) ([place])    non-ischemic; resolved EF 75%; GSPECT  . 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 Pre-diabetes   . Radiculopathy    followed by Dr. Krista Blue  . Syncope     ROS:   All systems reviewed and negative except as noted in the HPI.   Past Surgical History:  Procedure Laterality Date  . BREAST ENHANCEMENT SURGERY    . CATARACT EXTRACTION    .  DOPPLER ECHOCARDIOGRAPHY  03/2010   with normal left ventricular function, ejection fraction 68%, apical akinesis and mild mitral regurgitation  . ESOPHAGOGASTRODUODENOSCOPY    . INTRAOCULAR LENS INSERTION    . PACEMAKER GENERATOR CHANGE N/A 06/27/2011   Procedure: PACEMAKER GENERATOR CHANGE;  Surgeon: Evans Lance, MD;  Location: [facility] CATH LAB;  Service: Cardiovascular;  Laterality: N/A;  . PACEMAKER INSERTION  2005   complete heart block  . ROOT CANAL    . TOTAL ABDOMINAL HYSTERECTOMY W/ BILATERAL SALPINGOOPHORECTOMY  1989   X2  . UPPER GI ENDOSCOPY       Family History  Problem Relation Age of Onset  . Diabetes Mother   . Heart Problems Mother   . Anemia Mother   . Liver cancer Father   . Diabetes Father   . Alcoholism Father   . Diabetes Sister   . Heart attack Neg Hx   . Stroke  Neg Hx   . Hypertension Neg Hx      Social History   Socioeconomic History  . Marital status: Married    Spouse name: Not on file  . Number of children: Not on file  . Years of education: Not on file  . Highest education level: Not on file  Occupational History  . Not on file  Social Needs  . Financial resource strain: Not on file  . Food insecurity:    Worry: Not on file    Inability: Not on file  . Transportation needs:    Medical: Not on file    Non-medical: Not on file  Tobacco Use  . Smoking status: Former Smoker    Last attempt to quit: 06/23/1997    Years since quitting: 20.2  . Smokeless tobacco: Never Used  Substance and Sexual Activity  . Alcohol use: No  . Drug use: No  . Sexual activity: Not on file  Lifestyle  . Physical activity:    Days per week: Not on file    Minutes per session: Not on file  . Stress: Not on file  Relationships  . Social connections:    Talks on phone: Not on file    Gets together: Not on file    Attends religious service: Not on file    Active member of club or organization: Not on file    Attends meetings of clubs or organizations: Not on file    Relationship status: Not on file  . Intimate partner violence:    Fear of current or ex partner: Not on file    Emotionally abused: Not on file    Physically abused: Not on file    Forced sexual activity: Not on file  Other Topics Concern  . Not on file  Social History Narrative  . Not on file     BP (!) 118/58   Pulse 60   Ht 5' (1.524 m)   Wt 149 lb (67.6 kg)   BMI 29.10 kg/m   Physical Exam:  Well appearing NAD HEENT: Unremarkable Neck:  No JVD, no thyromegally Lymphatics:  No adenopathy Back:  No CVA tenderness Lungs:  Clear HEART:  Regular rate rhythm, no murmurs, no rubs, no clicks Abd:  soft, positive bowel sounds, no organomegally, no rebound, no guarding Ext:  2 plus pulses, no edema, no cyanosis, no clubbing Skin:  No rashes no nodules Neuro:  CN II  through XII intact, motor grossly intact  EKG - none  DEVICE  Normal device function.  See PaceArt for details.  Assess/Plan: 1. CHB - she is asymptomatic, s/p PPM insertion. Will follow. 2. HTN - her blood pressure is well controlled. Will follow. 3. PPM - her Medtronic DDD PM is working normally. Will recheck in several months.  [email or web address] [email or web address].D.

## 2017-10-16 ENCOUNTER — Telehealth: Payer: Self-pay | Admitting: Cardiology

## 2017-10-16 ENCOUNTER — Ambulatory Visit (INDEPENDENT_AMBULATORY_CARE_PROVIDER_SITE_OTHER): Payer: Medicare HMO | Admitting: *Deleted

## 2017-10-16 DIAGNOSIS — I442 Atrioventricular block, complete: Secondary | ICD-10-CM

## 2017-10-16 NOTE — Telephone Encounter (Signed)
Spoke with pt and reminded pt of remote transmission that is due today. Pt verbalized understanding.   

## 2017-10-17 ENCOUNTER — Encounter: Payer: Self-pay | Admitting: Cardiology

## 2017-10-17 NOTE — Progress Notes (Signed)
Letter  

## 2017-10-17 NOTE — Progress Notes (Signed)
Remote pacemaker transmission.   

## 2017-10-18 DIAGNOSIS — M25512 Pain in left shoulder: Secondary | ICD-10-CM | POA: Diagnosis not present

## 2017-10-18 LAB — CUP PACEART REMOTE DEVICE CHECK
Battery Impedance: 1658 Ohm
Battery Remaining Longevity: 33 mo
Battery Voltage: 2.76 V
Brady Statistic AP VP Percent: 46 %
Brady Statistic AS VS Percent: 0 %
Date Time Interrogation Session: 20190429195448
Implantable Lead Implant Date: 20130107
Implantable Lead Location: 753859
Implantable Lead Model: 5076
Implantable Pulse Generator Implant Date: 20130107
Lead Channel Impedance Value: 461 Ohm
Lead Channel Impedance Value: 601 Ohm
Lead Channel Setting Pacing Amplitude: 2 V
Lead Channel Setting Pacing Amplitude: 2.5 V
Lead Channel Setting Pacing Pulse Width: 0.64 ms
Lead Channel Setting Sensing Sensitivity: 2 mV
MDC IDC LEAD IMPLANT DT: 20130107
MDC IDC LEAD LOCATION: 753860
MDC IDC MSMT LEADCHNL RA PACING THRESHOLD AMPLITUDE: 1 V
MDC IDC MSMT LEADCHNL RA PACING THRESHOLD PULSEWIDTH: 0.4 ms
MDC IDC MSMT LEADCHNL RV PACING THRESHOLD AMPLITUDE: 1.25 V
MDC IDC MSMT LEADCHNL RV PACING THRESHOLD PULSEWIDTH: 0.4 ms
MDC IDC STAT BRADY AP VS PERCENT: 0 %
MDC IDC STAT BRADY AS VP PERCENT: 54 %

## 2017-11-08 DIAGNOSIS — R0781 Pleurodynia: Secondary | ICD-10-CM | POA: Diagnosis not present

## 2018-01-15 ENCOUNTER — Ambulatory Visit (INDEPENDENT_AMBULATORY_CARE_PROVIDER_SITE_OTHER): Payer: Medicare HMO | Admitting: *Deleted

## 2018-01-15 DIAGNOSIS — I442 Atrioventricular block, complete: Secondary | ICD-10-CM

## 2018-01-15 NOTE — Progress Notes (Signed)
Remote pacemaker transmission.   

## 2018-01-16 ENCOUNTER — Encounter: Payer: Self-pay | Admitting: Cardiology

## 2018-01-19 LAB — CUP PACEART REMOTE DEVICE CHECK
Battery Remaining Longevity: 29 mo
Battery Voltage: 2.75 V
Date Time Interrogation Session: 20190729173549
Implantable Lead Implant Date: 20130107
Implantable Lead Location: 753859
Implantable Lead Location: 753860
Implantable Lead Model: 5076
Implantable Lead Model: 5092
Lead Channel Impedance Value: 431 Ohm
Lead Channel Impedance Value: 544 Ohm
Lead Channel Pacing Threshold Amplitude: 1.625 V
Lead Channel Sensing Intrinsic Amplitude: 2.8 mV
Lead Channel Setting Pacing Amplitude: 2 V
Lead Channel Setting Pacing Amplitude: 2.5 V
Lead Channel Setting Pacing Pulse Width: 0.64 ms
Lead Channel Setting Sensing Sensitivity: 2 mV
MDC IDC LEAD IMPLANT DT: 20130107
MDC IDC MSMT BATTERY IMPEDANCE: 1801 Ohm
MDC IDC MSMT LEADCHNL RA PACING THRESHOLD AMPLITUDE: 1 V
MDC IDC MSMT LEADCHNL RA PACING THRESHOLD PULSEWIDTH: 0.4 ms
MDC IDC MSMT LEADCHNL RV PACING THRESHOLD PULSEWIDTH: 0.4 ms
MDC IDC PG IMPLANT DT: 20130107
MDC IDC STAT BRADY AP VP PERCENT: 37 %
MDC IDC STAT BRADY AP VS PERCENT: 0 %
MDC IDC STAT BRADY AS VP PERCENT: 63 %
MDC IDC STAT BRADY AS VS PERCENT: 0 %

## 2018-01-26 DIAGNOSIS — J209 Acute bronchitis, unspecified: Secondary | ICD-10-CM | POA: Diagnosis not present

## 2018-03-16 DIAGNOSIS — Z1231 Encounter for screening mammogram for malignant neoplasm of breast: Secondary | ICD-10-CM | POA: Diagnosis not present

## 2018-04-09 DIAGNOSIS — H52203 Unspecified astigmatism, bilateral: Secondary | ICD-10-CM | POA: Diagnosis not present

## 2018-04-09 DIAGNOSIS — Z961 Presence of intraocular lens: Secondary | ICD-10-CM | POA: Diagnosis not present

## 2018-04-16 ENCOUNTER — Telehealth: Payer: Self-pay | Admitting: Cardiology

## 2018-04-16 ENCOUNTER — Ambulatory Visit (INDEPENDENT_AMBULATORY_CARE_PROVIDER_SITE_OTHER): Payer: Medicare HMO | Admitting: *Deleted

## 2018-04-16 DIAGNOSIS — I5032 Chronic diastolic (congestive) heart failure: Secondary | ICD-10-CM

## 2018-04-16 DIAGNOSIS — I442 Atrioventricular block, complete: Secondary | ICD-10-CM

## 2018-04-16 LAB — CUP PACEART REMOTE DEVICE CHECK
Battery Remaining Longevity: 27 mo
Battery Voltage: 2.74 V
Brady Statistic AS VP Percent: 62 %
Date Time Interrogation Session: 20191028162912
Implantable Lead Implant Date: 20130107
Implantable Lead Location: 753859
Implantable Lead Location: 753860
Implantable Lead Model: 5076
Implantable Pulse Generator Implant Date: 20130107
Lead Channel Pacing Threshold Pulse Width: 0.4 ms
Lead Channel Pacing Threshold Pulse Width: 0.4 ms
Lead Channel Setting Pacing Amplitude: 2 V
Lead Channel Setting Pacing Pulse Width: 0.64 ms
MDC IDC LEAD IMPLANT DT: 20130107
MDC IDC MSMT BATTERY IMPEDANCE: 1981 Ohm
MDC IDC MSMT LEADCHNL RA IMPEDANCE VALUE: 488 Ohm
MDC IDC MSMT LEADCHNL RA PACING THRESHOLD AMPLITUDE: 1 V
MDC IDC MSMT LEADCHNL RV IMPEDANCE VALUE: 538 Ohm
MDC IDC MSMT LEADCHNL RV PACING THRESHOLD AMPLITUDE: 1.5 V
MDC IDC SET LEADCHNL RV PACING AMPLITUDE: 2.5 V
MDC IDC SET LEADCHNL RV SENSING SENSITIVITY: 2 mV
MDC IDC STAT BRADY AP VP PERCENT: 38 %
MDC IDC STAT BRADY AP VS PERCENT: 0 %
MDC IDC STAT BRADY AS VS PERCENT: 0 %

## 2018-04-16 NOTE — Telephone Encounter (Signed)
Confirmed remote transmission w/ pt husband.   

## 2018-04-17 NOTE — Progress Notes (Signed)
Remote pacemaker transmission.   

## 2018-05-07 DIAGNOSIS — E119 Type 2 diabetes mellitus without complications: Secondary | ICD-10-CM | POA: Diagnosis not present

## 2018-05-07 DIAGNOSIS — I10 Essential (primary) hypertension: Secondary | ICD-10-CM | POA: Diagnosis not present

## 2018-05-10 DIAGNOSIS — Z950 Presence of cardiac pacemaker: Secondary | ICD-10-CM | POA: Diagnosis not present

## 2018-05-10 DIAGNOSIS — M8580 Other specified disorders of bone density and structure, unspecified site: Secondary | ICD-10-CM | POA: Diagnosis not present

## 2018-05-10 DIAGNOSIS — R8290 Unspecified abnormal findings in urine: Secondary | ICD-10-CM | POA: Diagnosis not present

## 2018-05-10 DIAGNOSIS — E1121 Type 2 diabetes mellitus with diabetic nephropathy: Secondary | ICD-10-CM | POA: Diagnosis not present

## 2018-05-10 DIAGNOSIS — N183 Chronic kidney disease, stage 3 (moderate): Secondary | ICD-10-CM | POA: Diagnosis not present

## 2018-05-10 DIAGNOSIS — Z23 Encounter for immunization: Secondary | ICD-10-CM | POA: Diagnosis not present

## 2018-05-10 DIAGNOSIS — Z0000 Encounter for general adult medical examination without abnormal findings: Secondary | ICD-10-CM | POA: Diagnosis not present

## 2018-05-10 DIAGNOSIS — E039 Hypothyroidism, unspecified: Secondary | ICD-10-CM | POA: Diagnosis not present

## 2018-05-10 DIAGNOSIS — I10 Essential (primary) hypertension: Secondary | ICD-10-CM | POA: Diagnosis not present

## 2018-05-10 DIAGNOSIS — E782 Mixed hyperlipidemia: Secondary | ICD-10-CM | POA: Diagnosis not present

## 2018-07-16 ENCOUNTER — Ambulatory Visit (INDEPENDENT_AMBULATORY_CARE_PROVIDER_SITE_OTHER): Payer: Medicare HMO

## 2018-07-16 DIAGNOSIS — I442 Atrioventricular block, complete: Secondary | ICD-10-CM | POA: Diagnosis not present

## 2018-07-17 NOTE — Progress Notes (Signed)
Remote pacemaker transmission.   

## 2018-07-18 NOTE — Progress Notes (Signed)
Cardiology Office Note   Date:  07/19/2018   ID:  Phillips, Gabriela 04/14/1941, MRN [id number]  PCP:  Hulan Fess, MD    No chief complaint on file.  Chronic diastolic heart failure/pacemaker  Wt Readings from Last 3 Encounters:  07/19/18 151 lb (68.5 kg)  10/04/17 149 lb (67.6 kg)  06/01/17 149 lb 9.6 oz (67.9 kg)       History of Present Illness: Gabriela Phillips is a 78 y.o. female  who has had a pacer placed. SHe has nonobstructive CAD by cath in 2008. SHe had some chest pain and then a negative stress test in 10/14.   At last visit, it was noted: "Overall, she feels well.  She remains active.  She tries to eat healthy and watch portion sizes."  Since last visit, she has done well.  She has maintained her weight.  Her lab work is been well controlled.  She does exercise on a regular basis.  She has been trying to eat healthy as well.  Denies : Chest pain. Dizziness. Leg edema. Nitroglycerin use. Orthopnea. Palpitations. Paroxysmal nocturnal dyspnea. Shortness of breath. Syncope.       Past Medical History:  Diagnosis Date  . Anemia 2016  . CAD (coronary artery disease)    a. LHC (2/28): Severe HK at the apex, EF 40%, diffuse luminal irregularities, no significant CAD.;  b.  Lexiscan Myoview (2/13): EF 76%, normal perfusion; low-risk study;  c.  Lexiscan Myoview (10/14):  Low risk, no ischemia, EF 75%.  . Chronic systolic CHF (congestive heart failure) (HCC)    EF previously 40%; NICM; EF recovered;  Echo (03/2010): Normal LV function, EF 68.4%, apical AK, mild MR, mild TR, normal RV systolic pressure, diastolic dysfunction.  . CKD (chronic kidney disease), stage III ([place])   . Complete heart block ([place])   . Diabetes mellitus without complication ([place])   . Ejection fraction < 50%    68%  . Fatty liver   . Gastritis   . Hypertension   . Hypothyroidism   . Mild mitral regurgitation   . NICM (nonischemic cardiomyopathy) ([place])    non-ischemic; resolved EF  75%; GSPECT  . 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 Pre-diabetes   . Radiculopathy    followed by Dr. Krista Blue  . Syncope     Past Surgical History:  Procedure Laterality Date  . BREAST ENHANCEMENT SURGERY    . CATARACT EXTRACTION    . DOPPLER ECHOCARDIOGRAPHY  03/2010   with normal left ventricular function, ejection fraction 68%, apical akinesis and mild mitral regurgitation  . ESOPHAGOGASTRODUODENOSCOPY    . INTRAOCULAR LENS INSERTION    . PACEMAKER GENERATOR CHANGE N/A 06/27/2011   Procedure: PACEMAKER GENERATOR CHANGE;  Surgeon: Evans Lance, MD;  Location: [facility] CATH LAB;  Service: Cardiovascular;  Laterality: N/A;  . PACEMAKER INSERTION  2005   complete heart block  . ROOT CANAL    . TOTAL ABDOMINAL HYSTERECTOMY W/ BILATERAL SALPINGOOPHORECTOMY  1989   X2  . UPPER GI ENDOSCOPY       Current Outpatient Medications  Medication Sig Dispense Refill  . atorvastatin (LIPITOR) 40 MG tablet Take 40 mg by mouth daily.    . carvedilol (COREG) 6.25 MG tablet Take 6.25 mg by mouth 2 (two) times daily with a meal.      . Cranberry 450 MG CAPS Take 1 capsule by mouth daily.    . furosemide (LASIX) 40 MG tablet  40 mg. Take one tablet by mouth and if needed take one additional tablet    . levothyroxine (SYNTHROID, LEVOTHROID) 75 MCG tablet Take 75 mcg by mouth daily. Alternates with Levothyroxine 50 mcg.     Marland Kitchen LORazepam (ATIVAN) 1 MG tablet Take 1 mg by mouth 3 (three) times daily as needed for anxiety.    Marland Kitchen losartan (COZAAR) 100 MG tablet Take 100 mg by mouth daily.    . Omega-3 Fatty Acids (RA FISH OIL) 900 MG CAPS Take 4 capsules by mouth daily.     . potassium chloride (KLOR-CON) 20 MEQ packet 20 mEq. Take one tablet by mouth and if needed take one additional tablet     No current facility-administered medications for this visit.     Allergies:   Crestor [rosuvastatin calcium]; Nsaids; Lipitor [atorvastatin]; and Penicillins    Social History:  The patient   reports that she quit smoking about 21 years ago. She has never used smokeless tobacco. She reports that she does not drink alcohol or use drugs.   Family History:  The patient's family history includes Alcoholism in her father; Anemia in her mother; Diabetes in her father, mother, and sister; Heart Problems in her mother; Liver cancer in her father.    ROS:  Please see the history of present illness.   Otherwise, review of systems are positive for rare ankle swelling.   All other systems are reviewed and negative.    PHYSICAL EXAM: VS:  BP 122/72   Pulse 78   Ht 5' (1.524 m)   Wt 151 lb (68.5 kg)   SpO2 94%   BMI 29.49 kg/m  , BMI Body mass index is 29.49 kg/m. GEN: Well nourished, well developed, in no acute distress  HEENT: normal  Neck: no JVD, carotid bruits, or masses Cardiac: RRR; no murmurs, rubs, or gallops,no edema  Respiratory:  clear to auscultation bilaterally, normal work of breathing GI: soft, nontender, nondistended, + BS MS: no deformity or atrophy  Skin: warm and dry, no rash Neuro:  Strength and sensation are intact Psych: euthymic mood, full affect   EKG:   The ekg ordered today demonstrates paced rhythm   Recent Labs: No results found for requested labs within last 8760 hours.   Lipid Panel No results found for: CHOL, TRIG, HDL, CHOLHDL, VLDL, LDLCALC, LDLDIRECT   Other studies Reviewed: Additional studies/ records that were reviewed today with results demonstrating: Labs reviewed.   ASSESSMENT AND PLAN:  1. Chronic diastolic heart failure/DOE: She appears euvolemic.  Refill furosemide.  Dyspnea on exertion improved 2. HTN: Well-controlled.  Continue current medications. 3. Pacer: Continue routine follow-up with Dr. Lovena Le 4. Hyperlipidemia: LDL 51 in November 2019.  Triglycerides mildly elevated.  I encouraged her to continue to limit carbohydrate intake.  This will help with triglycerides.    Current medicines are reviewed at length with the  patient today.  The patient concerns regarding her medicines were addressed.  The following changes have been made:  No change:  Labs/ tests ordered today include:  No orders of the defined types were placed in this encounter.   Recommend 150 minutes/week of aerobic exercise Low fat, low carb, high fiber diet recommended  Disposition:   FU in 1 year   Signed, Larae Grooms, MD  07/19/2018 2:10 PM    [facility] [place], [address], [place]  [address] Phone: [phone]; Fax: [phone]

## 2018-07-19 ENCOUNTER — Ambulatory Visit: Payer: Medicare HMO | Admitting: Interventional Cardiology

## 2018-07-19 ENCOUNTER — Encounter: Payer: Self-pay | Admitting: Interventional Cardiology

## 2018-07-19 VITALS — BP 122/72 | HR 78 | Ht 60.0 in | Wt 151.0 lb

## 2018-07-19 DIAGNOSIS — E782 Mixed hyperlipidemia: Secondary | ICD-10-CM | POA: Diagnosis not present

## 2018-07-19 DIAGNOSIS — I10 Essential (primary) hypertension: Secondary | ICD-10-CM

## 2018-07-19 DIAGNOSIS — R0602 Shortness of breath: Secondary | ICD-10-CM

## 2018-07-19 DIAGNOSIS — Z950 Presence of cardiac pacemaker: Secondary | ICD-10-CM | POA: Diagnosis not present

## 2018-07-19 DIAGNOSIS — I5032 Chronic diastolic (congestive) heart failure: Secondary | ICD-10-CM | POA: Diagnosis not present

## 2018-07-19 LAB — CUP PACEART REMOTE DEVICE CHECK
Battery Impedance: 2145 Ohm
Battery Remaining Longevity: 25 mo
Battery Voltage: 2.75 V
Brady Statistic AS VP Percent: 62 %
Brady Statistic AS VS Percent: 0 %
Date Time Interrogation Session: 20200127142736
Implantable Lead Implant Date: 20130107
Implantable Lead Implant Date: 20130107
Implantable Lead Location: 753859
Implantable Lead Location: 753860
Implantable Lead Model: 5076
Implantable Lead Model: 5092
Implantable Pulse Generator Implant Date: 20130107
Lead Channel Impedance Value: 473 Ohm
Lead Channel Impedance Value: 604 Ohm
Lead Channel Pacing Threshold Amplitude: 1 V
Lead Channel Pacing Threshold Amplitude: 1.125 V
Lead Channel Pacing Threshold Pulse Width: 0.4 ms
Lead Channel Setting Pacing Amplitude: 2 V
Lead Channel Setting Pacing Amplitude: 2.5 V
Lead Channel Setting Pacing Pulse Width: 0.64 ms
Lead Channel Setting Sensing Sensitivity: 2 mV
MDC IDC MSMT LEADCHNL RA PACING THRESHOLD PULSEWIDTH: 0.4 ms
MDC IDC STAT BRADY AP VP PERCENT: 38 %
MDC IDC STAT BRADY AP VS PERCENT: 0 %

## 2018-07-19 MED ORDER — POTASSIUM CHLORIDE 20 MEQ PO PACK: 20.0000 meq | PACK | Freq: Every day | ORAL | 3 refills | Status: DC

## 2018-07-19 MED ORDER — FUROSEMIDE 40 MG PO TABS: 40.0000 mg | ORAL_TABLET | Freq: Every day | ORAL | 3 refills | Status: DC

## 2018-07-19 NOTE — Patient Instructions (Signed)

## 2018-07-20 MED ORDER — POTASSIUM CHLORIDE 20 MEQ PO PACK: 20.0000 meq | PACK | Freq: Every day | ORAL | 3 refills | Status: DC

## 2018-07-20 NOTE — Addendum Note (Signed)
Addended by: Drue Novel I on: 07/20/2018 11:19 AM   Modules accepted: Orders

## 2018-07-25 ENCOUNTER — Telehealth: Payer: Self-pay | Admitting: Interventional Cardiology

## 2018-07-25 MED ORDER — POTASSIUM CHLORIDE CRYS ER 20 MEQ PO TBCR: 20.0000 meq | EXTENDED_RELEASE_TABLET | Freq: Every day | ORAL | 3 refills | Status: DC

## 2018-07-25 NOTE — Telephone Encounter (Signed)
New Message   Pt c/o medication issue:  1. Name of Medication: potassium chloride (KLOR-CON) 20 MEQ packet    2. How are you currently taking this medication (dosage and times per day)? Take 20 mEq by mouth daily.  3. Are you having a reaction (difficulty breathing--STAT)?   4. What is your medication issue? Sharyn Lull with [facility] is wanting to confirm if the patient should be using the packet or the tablet. Please call.

## 2018-07-25 NOTE — Telephone Encounter (Signed)
Called and spoke to patient and she wishes to take the k-dur tablets. Rx sent in for tablets.

## 2018-10-15 ENCOUNTER — Other Ambulatory Visit: Payer: Self-pay

## 2018-10-15 ENCOUNTER — Ambulatory Visit (INDEPENDENT_AMBULATORY_CARE_PROVIDER_SITE_OTHER): Payer: Medicare HMO | Admitting: *Deleted

## 2018-10-15 DIAGNOSIS — I442 Atrioventricular block, complete: Secondary | ICD-10-CM | POA: Diagnosis not present

## 2018-10-15 DIAGNOSIS — I5032 Chronic diastolic (congestive) heart failure: Secondary | ICD-10-CM

## 2018-10-15 LAB — CUP PACEART REMOTE DEVICE CHECK
Battery Impedance: 2333 Ohm
Battery Remaining Longevity: 23 mo
Battery Voltage: 2.74 V
Brady Statistic AP VP Percent: 39 %
Brady Statistic AP VS Percent: 0 %
Brady Statistic AS VP Percent: 61 %
Brady Statistic AS VS Percent: 0 %
Date Time Interrogation Session: 20200427131643
Implantable Lead Implant Date: 20130107
Implantable Lead Implant Date: 20130107
Implantable Lead Location: 753859
Implantable Lead Location: 753860
Implantable Lead Model: 5076
Implantable Lead Model: 5092
Implantable Pulse Generator Implant Date: 20130107
Lead Channel Impedance Value: 474 Ohm
Lead Channel Impedance Value: 665 Ohm
Lead Channel Pacing Threshold Amplitude: 1.125 V
Lead Channel Pacing Threshold Amplitude: 1.125 V
Lead Channel Pacing Threshold Pulse Width: 0.4 ms
Lead Channel Pacing Threshold Pulse Width: 0.4 ms
Lead Channel Setting Pacing Amplitude: 2.25 V
Lead Channel Setting Pacing Amplitude: 2.5 V
Lead Channel Setting Pacing Pulse Width: 0.64 ms
Lead Channel Setting Sensing Sensitivity: 2 mV

## 2018-10-23 NOTE — Progress Notes (Signed)
Remote pacemaker transmission.   

## 2019-01-14 ENCOUNTER — Ambulatory Visit (INDEPENDENT_AMBULATORY_CARE_PROVIDER_SITE_OTHER): Payer: Medicare HMO | Admitting: *Deleted

## 2019-01-14 DIAGNOSIS — I442 Atrioventricular block, complete: Secondary | ICD-10-CM

## 2019-01-14 DIAGNOSIS — I5032 Chronic diastolic (congestive) heart failure: Secondary | ICD-10-CM

## 2019-01-14 LAB — CUP PACEART REMOTE DEVICE CHECK
Battery Impedance: 2350 Ohm
Battery Remaining Longevity: 23 mo
Battery Voltage: 2.73 V
Brady Statistic AP VP Percent: 39 %
Brady Statistic AP VS Percent: 0 %
Brady Statistic AS VP Percent: 61 %
Brady Statistic AS VS Percent: 0 %
Date Time Interrogation Session: 20200727154101
Implantable Lead Implant Date: 20130107
Implantable Lead Implant Date: 20130107
Implantable Lead Location: 753859
Implantable Lead Location: 753860
Implantable Lead Model: 5076
Implantable Lead Model: 5092
Implantable Pulse Generator Implant Date: 20130107
Lead Channel Impedance Value: 470 Ohm
Lead Channel Impedance Value: 629 Ohm
Lead Channel Pacing Threshold Amplitude: 1.125 V
Lead Channel Pacing Threshold Amplitude: 1.125 V
Lead Channel Pacing Threshold Pulse Width: 0.4 ms
Lead Channel Pacing Threshold Pulse Width: 0.4 ms
Lead Channel Setting Pacing Amplitude: 2.25 V
Lead Channel Setting Pacing Amplitude: 2.5 V
Lead Channel Setting Pacing Pulse Width: 0.64 ms
Lead Channel Setting Sensing Sensitivity: 2 mV

## 2019-01-30 NOTE — Progress Notes (Signed)
Remote pacemaker transmission.   

## 2019-02-15 DIAGNOSIS — E119 Type 2 diabetes mellitus without complications: Secondary | ICD-10-CM | POA: Diagnosis not present

## 2019-02-15 DIAGNOSIS — E782 Mixed hyperlipidemia: Secondary | ICD-10-CM | POA: Diagnosis not present

## 2019-02-15 DIAGNOSIS — E1121 Type 2 diabetes mellitus with diabetic nephropathy: Secondary | ICD-10-CM | POA: Diagnosis not present

## 2019-02-15 DIAGNOSIS — D62 Acute posthemorrhagic anemia: Secondary | ICD-10-CM | POA: Diagnosis not present

## 2019-02-15 DIAGNOSIS — I10 Essential (primary) hypertension: Secondary | ICD-10-CM | POA: Diagnosis not present

## 2019-02-15 DIAGNOSIS — E039 Hypothyroidism, unspecified: Secondary | ICD-10-CM | POA: Diagnosis not present

## 2019-02-15 DIAGNOSIS — M8580 Other specified disorders of bone density and structure, unspecified site: Secondary | ICD-10-CM | POA: Diagnosis not present

## 2019-02-15 DIAGNOSIS — N183 Chronic kidney disease, stage 3 (moderate): Secondary | ICD-10-CM | POA: Diagnosis not present

## 2019-03-29 DIAGNOSIS — Z1231 Encounter for screening mammogram for malignant neoplasm of breast: Secondary | ICD-10-CM | POA: Diagnosis not present

## 2019-04-16 ENCOUNTER — Ambulatory Visit (INDEPENDENT_AMBULATORY_CARE_PROVIDER_SITE_OTHER): Payer: Medicare HMO | Admitting: *Deleted

## 2019-04-16 DIAGNOSIS — I5032 Chronic diastolic (congestive) heart failure: Secondary | ICD-10-CM | POA: Diagnosis not present

## 2019-04-16 DIAGNOSIS — I442 Atrioventricular block, complete: Secondary | ICD-10-CM

## 2019-04-17 LAB — CUP PACEART REMOTE DEVICE CHECK
Battery Impedance: 2448 Ohm
Battery Remaining Longevity: 22 mo
Battery Voltage: 2.72 V
Brady Statistic AP VP Percent: 40 %
Brady Statistic AP VS Percent: 0 %
Brady Statistic AS VP Percent: 60 %
Brady Statistic AS VS Percent: 0 %
Date Time Interrogation Session: 20201027133948
Implantable Lead Implant Date: 20130107
Implantable Lead Implant Date: 20130107
Implantable Lead Location: 753859
Implantable Lead Location: 753860
Implantable Lead Model: 5076
Implantable Lead Model: 5092
Implantable Pulse Generator Implant Date: 20130107
Lead Channel Impedance Value: 475 Ohm
Lead Channel Impedance Value: 657 Ohm
Lead Channel Pacing Threshold Amplitude: 1.125 V
Lead Channel Pacing Threshold Amplitude: 1.125 V
Lead Channel Pacing Threshold Pulse Width: 0.4 ms
Lead Channel Pacing Threshold Pulse Width: 0.4 ms
Lead Channel Setting Pacing Amplitude: 2.25 V
Lead Channel Setting Pacing Amplitude: 2.5 V
Lead Channel Setting Pacing Pulse Width: 0.64 ms
Lead Channel Setting Sensing Sensitivity: 2 mV

## 2019-05-07 NOTE — Progress Notes (Signed)
Remote pacemaker transmission.   

## 2019-05-15 DIAGNOSIS — E1121 Type 2 diabetes mellitus with diabetic nephropathy: Secondary | ICD-10-CM | POA: Diagnosis not present

## 2019-05-15 DIAGNOSIS — N183 Chronic kidney disease, stage 3 unspecified: Secondary | ICD-10-CM | POA: Diagnosis not present

## 2019-05-15 DIAGNOSIS — E785 Hyperlipidemia, unspecified: Secondary | ICD-10-CM | POA: Diagnosis not present

## 2019-05-15 DIAGNOSIS — E039 Hypothyroidism, unspecified: Secondary | ICD-10-CM | POA: Diagnosis not present

## 2019-05-15 DIAGNOSIS — M8580 Other specified disorders of bone density and structure, unspecified site: Secondary | ICD-10-CM | POA: Diagnosis not present

## 2019-05-15 DIAGNOSIS — D62 Acute posthemorrhagic anemia: Secondary | ICD-10-CM | POA: Diagnosis not present

## 2019-05-15 DIAGNOSIS — I10 Essential (primary) hypertension: Secondary | ICD-10-CM | POA: Diagnosis not present

## 2019-05-27 DIAGNOSIS — Z1159 Encounter for screening for other viral diseases: Secondary | ICD-10-CM | POA: Diagnosis not present

## 2019-05-27 DIAGNOSIS — I10 Essential (primary) hypertension: Secondary | ICD-10-CM | POA: Diagnosis not present

## 2019-05-27 DIAGNOSIS — E119 Type 2 diabetes mellitus without complications: Secondary | ICD-10-CM | POA: Diagnosis not present

## 2019-05-27 DIAGNOSIS — N183 Chronic kidney disease, stage 3 unspecified: Secondary | ICD-10-CM | POA: Diagnosis not present

## 2019-05-31 DIAGNOSIS — N183 Chronic kidney disease, stage 3 unspecified: Secondary | ICD-10-CM | POA: Diagnosis not present

## 2019-05-31 DIAGNOSIS — R8290 Unspecified abnormal findings in urine: Secondary | ICD-10-CM | POA: Diagnosis not present

## 2019-05-31 DIAGNOSIS — E1121 Type 2 diabetes mellitus with diabetic nephropathy: Secondary | ICD-10-CM | POA: Diagnosis not present

## 2019-05-31 DIAGNOSIS — Z1159 Encounter for screening for other viral diseases: Secondary | ICD-10-CM | POA: Diagnosis not present

## 2019-05-31 DIAGNOSIS — I10 Essential (primary) hypertension: Secondary | ICD-10-CM | POA: Diagnosis not present

## 2019-05-31 DIAGNOSIS — E039 Hypothyroidism, unspecified: Secondary | ICD-10-CM | POA: Diagnosis not present

## 2019-05-31 DIAGNOSIS — E782 Mixed hyperlipidemia: Secondary | ICD-10-CM | POA: Diagnosis not present

## 2019-05-31 DIAGNOSIS — Z950 Presence of cardiac pacemaker: Secondary | ICD-10-CM | POA: Diagnosis not present

## 2019-06-04 DIAGNOSIS — D62 Acute posthemorrhagic anemia: Secondary | ICD-10-CM | POA: Diagnosis not present

## 2019-06-04 DIAGNOSIS — E119 Type 2 diabetes mellitus without complications: Secondary | ICD-10-CM | POA: Diagnosis not present

## 2019-06-04 DIAGNOSIS — I5032 Chronic diastolic (congestive) heart failure: Secondary | ICD-10-CM | POA: Diagnosis not present

## 2019-06-04 DIAGNOSIS — N183 Chronic kidney disease, stage 3 unspecified: Secondary | ICD-10-CM | POA: Diagnosis not present

## 2019-06-04 DIAGNOSIS — E785 Hyperlipidemia, unspecified: Secondary | ICD-10-CM | POA: Diagnosis not present

## 2019-06-04 DIAGNOSIS — M8580 Other specified disorders of bone density and structure, unspecified site: Secondary | ICD-10-CM | POA: Diagnosis not present

## 2019-06-04 DIAGNOSIS — E039 Hypothyroidism, unspecified: Secondary | ICD-10-CM | POA: Diagnosis not present

## 2019-06-04 DIAGNOSIS — I10 Essential (primary) hypertension: Secondary | ICD-10-CM | POA: Diagnosis not present

## 2019-07-16 ENCOUNTER — Ambulatory Visit (INDEPENDENT_AMBULATORY_CARE_PROVIDER_SITE_OTHER): Payer: Medicare HMO | Admitting: *Deleted

## 2019-07-16 DIAGNOSIS — I442 Atrioventricular block, complete: Secondary | ICD-10-CM | POA: Diagnosis not present

## 2019-07-16 LAB — CUP PACEART REMOTE DEVICE CHECK
Battery Impedance: 2964 Ohm
Battery Remaining Longevity: 17 mo
Battery Voltage: 2.73 V
Brady Statistic AP VP Percent: 42 %
Brady Statistic AP VS Percent: 0 %
Brady Statistic AS VP Percent: 58 %
Brady Statistic AS VS Percent: 0 %
Date Time Interrogation Session: 20210126105538
Implantable Lead Implant Date: 20130107
Implantable Lead Implant Date: 20130107
Implantable Lead Location: 753859
Implantable Lead Location: 753860
Implantable Lead Model: 5076
Implantable Lead Model: 5092
Implantable Pulse Generator Implant Date: 20130107
Lead Channel Impedance Value: 475 Ohm
Lead Channel Impedance Value: 628 Ohm
Lead Channel Pacing Threshold Amplitude: 1 V
Lead Channel Pacing Threshold Amplitude: 1.125 V
Lead Channel Pacing Threshold Pulse Width: 0.4 ms
Lead Channel Pacing Threshold Pulse Width: 0.4 ms
Lead Channel Setting Pacing Amplitude: 2.25 V
Lead Channel Setting Pacing Amplitude: 2.5 V
Lead Channel Setting Pacing Pulse Width: 0.64 ms
Lead Channel Setting Sensing Sensitivity: 2 mV

## 2019-07-19 DIAGNOSIS — E1121 Type 2 diabetes mellitus with diabetic nephropathy: Secondary | ICD-10-CM | POA: Diagnosis not present

## 2019-07-19 DIAGNOSIS — N183 Chronic kidney disease, stage 3 unspecified: Secondary | ICD-10-CM | POA: Diagnosis not present

## 2019-07-19 DIAGNOSIS — Z950 Presence of cardiac pacemaker: Secondary | ICD-10-CM | POA: Diagnosis not present

## 2019-07-19 DIAGNOSIS — E782 Mixed hyperlipidemia: Secondary | ICD-10-CM | POA: Diagnosis not present

## 2019-07-19 DIAGNOSIS — E039 Hypothyroidism, unspecified: Secondary | ICD-10-CM | POA: Diagnosis not present

## 2019-07-19 DIAGNOSIS — Z0000 Encounter for general adult medical examination without abnormal findings: Secondary | ICD-10-CM | POA: Diagnosis not present

## 2019-07-19 DIAGNOSIS — Z8719 Personal history of other diseases of the digestive system: Secondary | ICD-10-CM | POA: Diagnosis not present

## 2019-07-19 DIAGNOSIS — M8580 Other specified disorders of bone density and structure, unspecified site: Secondary | ICD-10-CM | POA: Diagnosis not present

## 2019-07-19 DIAGNOSIS — I10 Essential (primary) hypertension: Secondary | ICD-10-CM | POA: Diagnosis not present

## 2019-07-19 DIAGNOSIS — I5032 Chronic diastolic (congestive) heart failure: Secondary | ICD-10-CM | POA: Diagnosis not present

## 2019-08-27 ENCOUNTER — Encounter: Payer: Self-pay | Admitting: Interventional Cardiology

## 2019-08-27 ENCOUNTER — Other Ambulatory Visit: Payer: Self-pay

## 2019-08-27 ENCOUNTER — Ambulatory Visit: Payer: Medicare HMO | Admitting: Interventional Cardiology

## 2019-08-27 VITALS — BP 124/78 | HR 87 | Ht 60.0 in | Wt 140.0 lb

## 2019-08-27 DIAGNOSIS — I442 Atrioventricular block, complete: Secondary | ICD-10-CM | POA: Diagnosis not present

## 2019-08-27 DIAGNOSIS — E782 Mixed hyperlipidemia: Secondary | ICD-10-CM | POA: Diagnosis not present

## 2019-08-27 DIAGNOSIS — Z950 Presence of cardiac pacemaker: Secondary | ICD-10-CM | POA: Diagnosis not present

## 2019-08-27 DIAGNOSIS — I5032 Chronic diastolic (congestive) heart failure: Secondary | ICD-10-CM | POA: Diagnosis not present

## 2019-08-27 DIAGNOSIS — I10 Essential (primary) hypertension: Secondary | ICD-10-CM | POA: Diagnosis not present

## 2019-08-27 MED ORDER — FUROSEMIDE 40 MG PO TABS: 40.0000 mg | ORAL_TABLET | Freq: Every day | ORAL | 3 refills | Status: DC | PRN

## 2019-08-27 MED ORDER — POTASSIUM CHLORIDE CRYS ER 20 MEQ PO TBCR: 20.0000 meq | EXTENDED_RELEASE_TABLET | Freq: Every day | ORAL | 3 refills | Status: DC | PRN

## 2019-08-27 NOTE — Patient Instructions (Signed)
Medication Instructions:  Your physician has recommended you make the following change in your medication:   1. Take furosemide (lasix) 40 mg daily AS NEEDED  2. Take potassium (k-dur) 20 mEq AS NEEDED (only when you take your lasix)  *If you need a refill on your cardiac medications before your next appointment, please call your pharmacy*   Lab Work: None ordered  If you have labs (blood work) drawn today and your tests are completely normal, you will receive your results only by: Marland Kitchen MyChart Message (if you have MyChart) OR . A paper copy in the mail If you have any lab test that is abnormal or we need to change your treatment, we will call you to review the results.   Testing/Procedures: None ordered   Follow-Up: At [facility], you and your health needs are our priority.  As part of our continuing mission to provide you with exceptional heart care, we have created designated Provider Care Teams.  These Care Teams include your primary Cardiologist (physician) and Advanced Practice Providers (APPs -  Physician Assistants and Nurse Practitioners) who all work together to provide you with the care you need, when you need it.  We recommend signing up for the patient portal called "MyChart".  Sign up information is provided on this After Visit Summary.  MyChart is used to connect with patients for Virtual Visits (Telemedicine).  Patients are able to view lab/test results, encounter notes, upcoming appointments, etc.  Non-urgent messages can be sent to your provider as well.   To learn more about what you can do with MyChart, go to [email or web address].    Your next appointment:   12 month(s)  The format for your next appointment:   In Person  Provider:   You may see Larae Grooms, MD or one of the following Advanced Practice Providers on your designated Care Team:    Melina Copa, PA-C  Ermalinda Barrios, PA-C    Other Instructions

## 2019-08-27 NOTE — Progress Notes (Signed)
Cardiology Office Note   Date:  08/27/2019   ID:  Gabriela Phillips, DOB 07-31-1940, MRN [id number]  PCP:  Hulan Fess, MD    No chief complaint on file.  Chronic diastolic heart failure  Wt Readings from Last 3 Encounters:  08/27/19 140 lb (63.5 kg)  07/19/18 151 lb (68.5 kg)  10/04/17 149 lb (67.6 kg)       History of Present Illness: Gabriela Phillips is a 79 y.o. female   who has had a pacer placed. SHe has nonobstructive CAD by cath in 2008. SHe had some chest pain and then a negative stress test in 10/14.   In the past, it was noted: "Overall, she feels well. She remains active. She tries to eat healthy and watch portion sizes."  Since the last visit, she did lose weight.  She has been vaccinated for COVID.   Denies : Chest pain. Dizziness. Leg edema. Nitroglycerin use. Orthopnea. Palpitations. Paroxysmal nocturnal dyspnea. Shortness of breath. Syncope.   She decreased the frequency of Lasix due to lack of swelling since weight loss.    Past Medical History:  Diagnosis Date  . Anemia 2016  . CAD (coronary artery disease)    a. LHC (2/28): Severe HK at the apex, EF 40%, diffuse luminal irregularities, no significant CAD.;  b.  Lexiscan Myoview (2/13): EF 76%, normal perfusion; low-risk study;  c.  Lexiscan Myoview (10/14):  Low risk, no ischemia, EF 75%.  . Chronic systolic CHF (congestive heart failure) (HCC)    EF previously 40%; NICM; EF recovered;  Echo (03/2010): Normal LV function, EF 68.4%, apical AK, mild MR, mild TR, normal RV systolic pressure, diastolic dysfunction.  . CKD (chronic kidney disease), stage III   . Complete heart block ([place])   . Diabetes mellitus without complication ([place])   . Ejection fraction < 50%    68%  . Fatty liver   . Gastritis   . Hypertension   . Hypothyroidism   . Mild mitral regurgitation   . NICM (nonischemic cardiomyopathy) ([place])    non-ischemic; resolved EF 75%; GSPECT  . Pacemaker 2005   Medtronic [id number] for  third-degree AV block  . Pre-diabetes   . Radiculopathy    followed by Dr. Krista Blue  . Syncope     Past Surgical History:  Procedure Laterality Date  . BREAST ENHANCEMENT SURGERY    . CATARACT EXTRACTION    . DOPPLER ECHOCARDIOGRAPHY  03/2010   with normal left ventricular function, ejection fraction 68%, apical akinesis and mild mitral regurgitation  . ESOPHAGOGASTRODUODENOSCOPY    . INTRAOCULAR LENS INSERTION    . PACEMAKER GENERATOR CHANGE N/A 06/27/2011   Procedure: PACEMAKER GENERATOR CHANGE;  Surgeon: Evans Lance, MD;  Location: [facility] C[facility] LAB;  Service: Cardiovascular;  Laterality: N/A;  . PACEMAKER INSERTION  2005   complete heart block  . ROOT CANAL    . TOTAL ABDOMINAL HYSTERECTOMY W/ BILATERAL SALPINGOOPHORECTOMY  1989   X2  . UPPER GI ENDOSCOPY       Current Outpatient Medications  Medication Sig Dispense Refill  . atorvastatin (LIPITOR) 40 MG tablet Take 40 mg by mouth daily.    . carvedilol (COREG) 6.25 MG tablet Take 6.25 mg by mouth 2 (two) times daily with a meal.      . Cranberry 450 MG CAPS Take 1 capsule by mouth daily.    . furosemide (LASIX) 40 MG tablet Take 1 tablet (40 mg total) by mouth daily. Take one tablet by mouth  and if needed take one additional tablet 90 tablet 3  . levothyroxine (SYNTHROID, LEVOTHROID) 75 MCG tablet Take 75 mcg by mouth daily. Alternates with Levothyroxine 50 mcg.     Marland Kitchen LORazepam (ATIVAN) 1 MG tablet Take 1 mg by mouth 3 (three) times daily as needed for anxiety.    Marland Kitchen losartan (COZAAR) 100 MG tablet Take 100 mg by mouth daily.    . Omega-3 Fatty Acids (RA FISH OIL) 900 MG CAPS Take 4 capsules by mouth daily.     . potassium chloride SA (K-DUR,KLOR-CON) 20 MEQ tablet Take 1 tablet (20 mEq total) by mouth daily. 90 tablet 3   No current facility-administered medications for this visit.    Allergies:   Crestor [rosuvastatin calcium], Nsaids, Lipitor [atorvastatin], and Penicillins    Social History:  The patient  reports that she  quit smoking about 22 years ago. She has never used smokeless tobacco. She reports that she does not drink alcohol or use drugs.   Family History:  The patient's family history includes Alcoholism in her father; Anemia in her mother; Diabetes in her father, mother, and sister; Heart Problems in her mother; Liver cancer in her father.    ROS:  Please see the history of present illness.   Otherwise, review of systems are positive for intentional weight loss.   All other systems are reviewed and negative.    PHYSICAL EXAM: VS:  BP 124/78   Pulse 87   Ht 5' (1.524 m)   Wt 140 lb (63.5 kg)   SpO2 96%   BMI 27.34 kg/m  , BMI Body mass index is 27.34 kg/m. GEN: Well nourished, well developed, in no acute distress  HEENT: normal  Neck: no JVD, carotid bruits, or masses Cardiac: RRR; no murmurs, rubs, or gallops,no edema  Respiratory:  clear to auscultation bilaterally, normal work of breathing GI: soft, nontender, nondistended, + BS MS: no deformity or atrophy  Skin: warm and dry, no rash Neuro:  Strength and sensation are intact Psych: euthymic mood, full affect   EKG:   The ekg ordered today demonstrates AV pacing   Recent Labs: No results found for requested labs within last 8760 hours.   Lipid Panel No results found for: CHOL, TRIG, HDL, CHOLHDL, VLDL, LDLCALC, LDLDIRECT   Other studies Reviewed: Additional studies/ records that were reviewed today with results demonstrating: PMD labs reviewed.   ASSESSMENT AND PLAN:  1. Chronic diastolic heart failure/dyspnea on exertion: Continue furosemide.  This has helped her dyspnea in the past. 2. Hypertension: The current medical regimen is effective;  continue present plan and medications. 3. Pacemaker: Continue routine follow-up with Dr. Lovena Le. 4. Hyperlipidemia: Triglycerides have been elevated in the past.  She has been counseled to limit carbohydrate intake.  Healthier diet recently has helped. 5. Prediabetes: A1C 6.4.   Healthy diet and continued walking.     Current medicines are reviewed at length with the patient today.  The patient concerns regarding her medicines were addressed.  The following changes have been made:  Can use Lasix 40 mg daily prn edema, and potassium when she uses Lasix  Labs/ tests ordered today include:  No orders of the defined types were placed in this encounter.   Recommend 150 minutes/week of aerobic exercise Low fat, low carb, high fiber diet recommended  Disposition:   FU in 1 year   Signed, Larae Grooms, MD  08/27/2019 3:41 PM    [facility] [address],  [place]  [address] Phone: [phone]; Fax: [phone]

## 2019-09-03 ENCOUNTER — Other Ambulatory Visit: Payer: Self-pay

## 2019-09-03 ENCOUNTER — Encounter: Payer: Self-pay | Admitting: Internal Medicine

## 2019-09-03 ENCOUNTER — Ambulatory Visit: Payer: Medicare HMO | Admitting: Internal Medicine

## 2019-09-03 VITALS — BP 142/80 | HR 88 | Ht 60.0 in | Wt 140.0 lb

## 2019-09-03 DIAGNOSIS — I442 Atrioventricular block, complete: Secondary | ICD-10-CM | POA: Diagnosis not present

## 2019-09-03 DIAGNOSIS — I10 Essential (primary) hypertension: Secondary | ICD-10-CM

## 2019-09-03 DIAGNOSIS — Z950 Presence of cardiac pacemaker: Secondary | ICD-10-CM

## 2019-09-03 NOTE — Progress Notes (Signed)
HPI [name] returns today for followup. She is a pleasant 79 yo man with a h/o HTN, diastolic CHF, and CHB, s/p PPM insertion. She is just over a year out from ERI. She has not had chest pain, sob or syncope.  Allergies  Allergen Reactions  . Crestor [Rosuvastatin Calcium]     CHEST TIGHTNESS, SOB  . Nsaids   . Lipitor [Atorvastatin]     MUSCLE ACHES  . Penicillins Other (See Comments)    Dark stool     Current Outpatient Medications  Medication Sig Dispense Refill  . atorvastatin (LIPITOR) 40 MG tablet Take 40 mg by mouth daily.    . carvedilol (COREG) 6.25 MG tablet Take 6.25 mg by mouth 2 (two) times daily with a meal.      . Cranberry 450 MG CAPS Take 1 capsule by mouth daily.    . furosemide (LASIX) 40 MG tablet Take 1 tablet (40 mg total) by mouth daily as needed. 90 tablet 3  . levothyroxine (SYNTHROID, LEVOTHROID) 75 MCG tablet Take 75 mcg by mouth daily. Alternates with Levothyroxine 50 mcg.     Marland Kitchen LORazepam (ATIVAN) 1 MG tablet Take 1 mg by mouth 3 (three) times daily as needed for anxiety.    Marland Kitchen losartan (COZAAR) 100 MG tablet Take 100 mg by mouth daily.    . Omega-3 Fatty Acids (RA FISH OIL) 900 MG CAPS Take 4 capsules by mouth daily.     . potassium chloride SA (KLOR-CON) 20 MEQ tablet Take 1 tablet (20 mEq total) by mouth daily as needed. 90 tablet 3   No current facility-administered medications for this visit.     Past Medical History:  Diagnosis Date  . Anemia 2016  . CAD (coronary artery disease)    a. LHC (2/28): Severe HK at the apex, EF 40%, diffuse luminal irregularities, no significant CAD.;  b.  Lexiscan Myoview (2/13): EF 76%, normal perfusion; low-risk study;  c.  Lexiscan Myoview (10/14):  Low risk, no ischemia, EF 75%.  . Chronic systolic CHF (congestive heart failure) (HCC)    EF previously 40%; NICM; EF recovered;  Echo (03/2010): Normal LV function, EF 68.4%, apical AK, mild MR, mild TR, normal RV systolic pressure, diastolic  dysfunction.  . CKD (chronic kidney disease), stage III   . Complete heart block ([place])   . Diabetes mellitus without complication ([place])   . Ejection fraction < 50%    68%  . Fatty liver   . Gastritis   . Hypertension   . Hypothyroidism   . Mild mitral regurgitation   . NICM (nonischemic cardiomyopathy) ([place])    non-ischemic; resolved EF 75%; GSPECT  . Pacemaker 2005   Medtronic [id number] for third-degree AV block  . Pre-diabetes   . Radiculopathy    followed by Dr. Krista Blue  . Syncope     ROS:   All systems reviewed and negative except as noted in the HPI.   Past Surgical History:  Procedure Laterality Date  . BREAST ENHANCEMENT SURGERY    . CATARACT EXTRACTION    . DOPPLER ECHOCARDIOGRAPHY  03/2010   with normal left ventricular function, ejection fraction 68%, apical akinesis and mild mitral regurgitation  . ESOPHAGOGASTRODUODENOSCOPY    . INTRAOCULAR LENS INSERTION    . PACEMAKER GENERATOR CHANGE N/A 06/27/2011   Procedure: PACEMAKER GENERATOR CHANGE;  Surgeon: Evans Lance, MD;  Location: [facility] CATH LAB;  Service: Cardiovascular;  Laterality: N/A;  . PACEMAKER INSERTION  2005  complete heart block  . ROOT CANAL    . TOTAL ABDOMINAL HYSTERECTOMY W/ BILATERAL SALPINGOOPHORECTOMY  1989   X2  . UPPER GI ENDOSCOPY       Family History  Problem Relation Age of Onset  . Diabetes Mother   . Heart Problems Mother   . Anemia Mother   . Liver cancer Father   . Diabetes Father   . Alcoholism Father   . Diabetes Sister   . Heart attack Neg Hx   . Stroke Neg Hx   . Hypertension Neg Hx      Social History   Socioeconomic History  . Marital status: Married    Spouse name: Not on file  . Number of children: Not on file  . Years of education: Not on file  . Highest education level: Not on file  Occupational History  . Not on file  Tobacco Use  . Smoking status: Former Smoker    Quit date: 06/23/1997    Years since quitting: 22.2  . Smokeless tobacco: Never Used   Substance and Sexual Activity  . Alcohol use: No  . Drug use: No  . Sexual activity: Not on file  Other Topics Concern  . Not on file  Social History Narrative  . Not on file   Social Determinants of Health   Financial Resource Strain:   . Difficulty of Paying Living Expenses:   Food Insecurity:   . Worried About Charity fundraiser in the Last Year:   . Arboriculturist in the Last Year:   Transportation Needs:   . Film/video editor (Medical):   Marland Kitchen Lack of Transportation (Non-Medical):   Physical Activity:   . Days of Exercise per Week:   . Minutes of Exercise per Session:   Stress:   . Feeling of Stress :   Social Connections:   . Frequency of Communication with Friends and Family:   . Frequency of Social Gatherings with Friends and Family:   . Attends Religious Services:   . Active Member of Clubs or Organizations:   . Attends Archivist Meetings:   Marland Kitchen Marital Status:   Intimate Partner Violence:   . Fear of Current or Ex-Partner:   . Emotionally Abused:   Marland Kitchen Physically Abused:   . Sexually Abused:      BP (!) 142/80   Pulse 88   Ht 5' (1.524 m)   Wt 140 lb (63.5 kg)   SpO2 95%   BMI 27.34 kg/m   Physical Exam:  Well appearing NAD HEENT: Unremarkable Neck:  No JVD, no thyromegally Lymphatics:  No adenopathy Back:  No CVA tenderness Lungs:  Clear with no wheezes HEART:  Regular rate rhythm, no murmurs, no rubs, no clicks Abd:  soft, positive bowel sounds, no organomegally, no rebound, no guarding Ext:  2 plus pulses, no edema, no cyanosis, no clubbing Skin:  No rashes no nodules Neuro:  CN II through XII intact, motor grossly intact  DEVICE  Normal device function.  See PaceArt for details.   Assess/Plan: 1. Chronic diastolic heart failure - she is class 2 and doing well.  2. HTN - her sbp is minimally elevated. She is encouraged to avoid salty food. 3. CHB - she is asymptomatic, s/p PPM insertion.  4. PPM - her medtronic DDD PM is  working normally and is about a year from ERI.   Mikle Bosworth.D.

## 2019-09-03 NOTE — Patient Instructions (Signed)
Medication Instructions:  Your physician recommends that you continue on your current medications as directed. Please refer to the Current Medication list given to you today.  Labwork: None ordered.  Testing/Procedures: None ordered.  Follow-Up: Your physician wants you to follow-up in: one year with Dr. Lovena Le.   You will receive a reminder letter in the mail two months in advance. If you don't receive a letter, please call our office to schedule the follow-up appointment.  Remote monitoring is used to monitor your Pacemaker from home. This monitoring reduces the number of office visits required to check your device to one time per year. It allows [place] to keep an eye on the functioning of your device to ensure it is working properly. You are scheduled for a device check from home on 10/15/2019. You may send your transmission at any time that day. If you have a wireless device, the transmission will be sent automatically. After your physician reviews your transmission, you will receive a postcard with your next transmission date.  Any Other Special Instructions Will Be Listed Below (If Applicable).  If you need a refill on your cardiac medications before your next appointment, please call your pharmacy.

## 2019-09-24 DIAGNOSIS — H52203 Unspecified astigmatism, bilateral: Secondary | ICD-10-CM | POA: Diagnosis not present

## 2019-09-24 DIAGNOSIS — Z961 Presence of intraocular lens: Secondary | ICD-10-CM | POA: Diagnosis not present

## 2019-09-24 DIAGNOSIS — E119 Type 2 diabetes mellitus without complications: Secondary | ICD-10-CM | POA: Diagnosis not present

## 2019-10-09 DIAGNOSIS — I5032 Chronic diastolic (congestive) heart failure: Secondary | ICD-10-CM | POA: Diagnosis not present

## 2019-10-09 DIAGNOSIS — E1121 Type 2 diabetes mellitus with diabetic nephropathy: Secondary | ICD-10-CM | POA: Diagnosis not present

## 2019-10-09 DIAGNOSIS — D62 Acute posthemorrhagic anemia: Secondary | ICD-10-CM | POA: Diagnosis not present

## 2019-10-09 DIAGNOSIS — M8580 Other specified disorders of bone density and structure, unspecified site: Secondary | ICD-10-CM | POA: Diagnosis not present

## 2019-10-09 DIAGNOSIS — E119 Type 2 diabetes mellitus without complications: Secondary | ICD-10-CM | POA: Diagnosis not present

## 2019-10-09 DIAGNOSIS — I10 Essential (primary) hypertension: Secondary | ICD-10-CM | POA: Diagnosis not present

## 2019-10-09 DIAGNOSIS — E782 Mixed hyperlipidemia: Secondary | ICD-10-CM | POA: Diagnosis not present

## 2019-10-09 DIAGNOSIS — E785 Hyperlipidemia, unspecified: Secondary | ICD-10-CM | POA: Diagnosis not present

## 2019-10-09 DIAGNOSIS — E039 Hypothyroidism, unspecified: Secondary | ICD-10-CM | POA: Diagnosis not present

## 2019-10-15 ENCOUNTER — Ambulatory Visit (INDEPENDENT_AMBULATORY_CARE_PROVIDER_SITE_OTHER): Payer: Medicare HMO | Admitting: *Deleted

## 2019-10-15 DIAGNOSIS — I442 Atrioventricular block, complete: Secondary | ICD-10-CM

## 2019-10-15 LAB — CUP PACEART REMOTE DEVICE CHECK
Battery Impedance: 3270 Ohm
Battery Remaining Longevity: 15 mo
Battery Voltage: 2.71 V
Brady Statistic AP VP Percent: 60 %
Brady Statistic AP VS Percent: 0 %
Brady Statistic AS VP Percent: 40 %
Brady Statistic AS VS Percent: 0 %
Date Time Interrogation Session: 20210427115603
Implantable Lead Implant Date: 20130107
Implantable Lead Implant Date: 20130107
Implantable Lead Location: 753859
Implantable Lead Location: 753860
Implantable Lead Model: 5076
Implantable Lead Model: 5092
Implantable Pulse Generator Implant Date: 20130107
Lead Channel Impedance Value: 487 Ohm
Lead Channel Impedance Value: 654 Ohm
Lead Channel Pacing Threshold Amplitude: 1 V
Lead Channel Pacing Threshold Amplitude: 1.125 V
Lead Channel Pacing Threshold Pulse Width: 0.4 ms
Lead Channel Pacing Threshold Pulse Width: 0.4 ms
Lead Channel Setting Pacing Amplitude: 2.25 V
Lead Channel Setting Pacing Amplitude: 2.5 V
Lead Channel Setting Pacing Pulse Width: 0.64 ms
Lead Channel Setting Sensing Sensitivity: 2 mV

## 2019-10-16 NOTE — Progress Notes (Signed)
PPM Remote  

## 2019-12-13 DIAGNOSIS — I5032 Chronic diastolic (congestive) heart failure: Secondary | ICD-10-CM | POA: Diagnosis not present

## 2019-12-13 DIAGNOSIS — E1121 Type 2 diabetes mellitus with diabetic nephropathy: Secondary | ICD-10-CM | POA: Diagnosis not present

## 2019-12-13 DIAGNOSIS — E119 Type 2 diabetes mellitus without complications: Secondary | ICD-10-CM | POA: Diagnosis not present

## 2019-12-13 DIAGNOSIS — E782 Mixed hyperlipidemia: Secondary | ICD-10-CM | POA: Diagnosis not present

## 2019-12-13 DIAGNOSIS — D62 Acute posthemorrhagic anemia: Secondary | ICD-10-CM | POA: Diagnosis not present

## 2019-12-13 DIAGNOSIS — E039 Hypothyroidism, unspecified: Secondary | ICD-10-CM | POA: Diagnosis not present

## 2019-12-13 DIAGNOSIS — E785 Hyperlipidemia, unspecified: Secondary | ICD-10-CM | POA: Diagnosis not present

## 2019-12-13 DIAGNOSIS — I10 Essential (primary) hypertension: Secondary | ICD-10-CM | POA: Diagnosis not present

## 2019-12-13 DIAGNOSIS — M8580 Other specified disorders of bone density and structure, unspecified site: Secondary | ICD-10-CM | POA: Diagnosis not present

## 2020-01-14 ENCOUNTER — Ambulatory Visit (INDEPENDENT_AMBULATORY_CARE_PROVIDER_SITE_OTHER): Payer: Medicare HMO | Admitting: *Deleted

## 2020-01-14 DIAGNOSIS — I442 Atrioventricular block, complete: Secondary | ICD-10-CM

## 2020-01-15 LAB — CUP PACEART REMOTE DEVICE CHECK
Battery Impedance: 3725 Ohm
Battery Remaining Longevity: 12 mo
Battery Voltage: 2.69 V
Brady Statistic AP VP Percent: 53 %
Brady Statistic AP VS Percent: 0 %
Brady Statistic AS VP Percent: 47 %
Brady Statistic AS VS Percent: 0 %
Date Time Interrogation Session: 20210727085325
Implantable Lead Implant Date: 20130107
Implantable Lead Implant Date: 20130107
Implantable Lead Location: 753859
Implantable Lead Location: 753860
Implantable Lead Model: 5076
Implantable Lead Model: 5092
Implantable Pulse Generator Implant Date: 20130107
Lead Channel Impedance Value: 476 Ohm
Lead Channel Impedance Value: 670 Ohm
Lead Channel Pacing Threshold Amplitude: 1 V
Lead Channel Pacing Threshold Amplitude: 1.125 V
Lead Channel Pacing Threshold Pulse Width: 0.4 ms
Lead Channel Pacing Threshold Pulse Width: 0.4 ms
Lead Channel Setting Pacing Amplitude: 2.25 V
Lead Channel Setting Pacing Amplitude: 2.5 V
Lead Channel Setting Pacing Pulse Width: 0.64 ms
Lead Channel Setting Sensing Sensitivity: 2 mV

## 2020-01-16 DIAGNOSIS — E785 Hyperlipidemia, unspecified: Secondary | ICD-10-CM | POA: Diagnosis not present

## 2020-01-16 DIAGNOSIS — I5032 Chronic diastolic (congestive) heart failure: Secondary | ICD-10-CM | POA: Diagnosis not present

## 2020-01-16 DIAGNOSIS — I10 Essential (primary) hypertension: Secondary | ICD-10-CM | POA: Diagnosis not present

## 2020-01-16 DIAGNOSIS — E039 Hypothyroidism, unspecified: Secondary | ICD-10-CM | POA: Diagnosis not present

## 2020-01-16 DIAGNOSIS — D62 Acute posthemorrhagic anemia: Secondary | ICD-10-CM | POA: Diagnosis not present

## 2020-01-16 DIAGNOSIS — E782 Mixed hyperlipidemia: Secondary | ICD-10-CM | POA: Diagnosis not present

## 2020-01-16 DIAGNOSIS — E1121 Type 2 diabetes mellitus with diabetic nephropathy: Secondary | ICD-10-CM | POA: Diagnosis not present

## 2020-01-16 DIAGNOSIS — M8580 Other specified disorders of bone density and structure, unspecified site: Secondary | ICD-10-CM | POA: Diagnosis not present

## 2020-01-16 DIAGNOSIS — E119 Type 2 diabetes mellitus without complications: Secondary | ICD-10-CM | POA: Diagnosis not present

## 2020-01-17 NOTE — Progress Notes (Signed)
Remote pacemaker transmission.   

## 2020-02-07 DIAGNOSIS — M25511 Pain in right shoulder: Secondary | ICD-10-CM | POA: Diagnosis not present

## 2020-02-12 DIAGNOSIS — M25551 Pain in right hip: Secondary | ICD-10-CM | POA: Diagnosis not present

## 2020-02-12 DIAGNOSIS — M7501 Adhesive capsulitis of right shoulder: Secondary | ICD-10-CM | POA: Diagnosis not present

## 2020-02-12 DIAGNOSIS — M545 Low back pain: Secondary | ICD-10-CM | POA: Diagnosis not present

## 2020-02-13 DIAGNOSIS — I5032 Chronic diastolic (congestive) heart failure: Secondary | ICD-10-CM | POA: Diagnosis not present

## 2020-02-13 DIAGNOSIS — D62 Acute posthemorrhagic anemia: Secondary | ICD-10-CM | POA: Diagnosis not present

## 2020-02-13 DIAGNOSIS — E785 Hyperlipidemia, unspecified: Secondary | ICD-10-CM | POA: Diagnosis not present

## 2020-02-13 DIAGNOSIS — E039 Hypothyroidism, unspecified: Secondary | ICD-10-CM | POA: Diagnosis not present

## 2020-02-13 DIAGNOSIS — E782 Mixed hyperlipidemia: Secondary | ICD-10-CM | POA: Diagnosis not present

## 2020-02-13 DIAGNOSIS — I10 Essential (primary) hypertension: Secondary | ICD-10-CM | POA: Diagnosis not present

## 2020-02-13 DIAGNOSIS — E119 Type 2 diabetes mellitus without complications: Secondary | ICD-10-CM | POA: Diagnosis not present

## 2020-02-13 DIAGNOSIS — M8580 Other specified disorders of bone density and structure, unspecified site: Secondary | ICD-10-CM | POA: Diagnosis not present

## 2020-02-13 DIAGNOSIS — E1121 Type 2 diabetes mellitus with diabetic nephropathy: Secondary | ICD-10-CM | POA: Diagnosis not present

## 2020-03-02 DIAGNOSIS — D62 Acute posthemorrhagic anemia: Secondary | ICD-10-CM | POA: Diagnosis not present

## 2020-03-02 DIAGNOSIS — E119 Type 2 diabetes mellitus without complications: Secondary | ICD-10-CM | POA: Diagnosis not present

## 2020-03-02 DIAGNOSIS — E782 Mixed hyperlipidemia: Secondary | ICD-10-CM | POA: Diagnosis not present

## 2020-03-02 DIAGNOSIS — E039 Hypothyroidism, unspecified: Secondary | ICD-10-CM | POA: Diagnosis not present

## 2020-03-02 DIAGNOSIS — M8580 Other specified disorders of bone density and structure, unspecified site: Secondary | ICD-10-CM | POA: Diagnosis not present

## 2020-03-02 DIAGNOSIS — I10 Essential (primary) hypertension: Secondary | ICD-10-CM | POA: Diagnosis not present

## 2020-03-02 DIAGNOSIS — E1121 Type 2 diabetes mellitus with diabetic nephropathy: Secondary | ICD-10-CM | POA: Diagnosis not present

## 2020-03-02 DIAGNOSIS — I5032 Chronic diastolic (congestive) heart failure: Secondary | ICD-10-CM | POA: Diagnosis not present

## 2020-03-02 DIAGNOSIS — E785 Hyperlipidemia, unspecified: Secondary | ICD-10-CM | POA: Diagnosis not present

## 2020-03-13 DIAGNOSIS — M7501 Adhesive capsulitis of right shoulder: Secondary | ICD-10-CM | POA: Diagnosis not present

## 2020-03-16 ENCOUNTER — Telehealth: Payer: Self-pay | Admitting: Interventional Cardiology

## 2020-03-16 NOTE — Telephone Encounter (Signed)
New message   Pt husband called in and stated that they just received the pacemaker but they are not complete the test and it is not working correctly.  They would like to speak with someone for further instructions  Best number  [phone]

## 2020-03-16 NOTE — Telephone Encounter (Signed)
The pt received the new cord for the wire x. I told them to take the old cord out and replace it with the new cord. I called [employer] to get additional help. [employer] is sending the pt a new monitor in 7-10 business days.

## 2020-03-23 ENCOUNTER — Telehealth: Payer: Self-pay

## 2020-03-23 NOTE — Telephone Encounter (Signed)
Spoke with pt, advised device is 11 months until ERI.  Establishing monthly battery checs.  Pt has next appt with Dr. Lovena Le scheduled for 08/2020.    Pt has regular transmission scheduled for 04/14/20.  Next battery check will be 05/20/20.

## 2020-03-25 NOTE — Telephone Encounter (Signed)
Pt did receive new monitor and sent transmission on 03-19-2020.

## 2020-03-25 NOTE — Telephone Encounter (Signed)
Calling patient to see if she has received a new monitor yet.   No answer, LMOVM.

## 2020-04-03 DIAGNOSIS — Z1231 Encounter for screening mammogram for malignant neoplasm of breast: Secondary | ICD-10-CM | POA: Diagnosis not present

## 2020-04-03 DIAGNOSIS — I10 Essential (primary) hypertension: Secondary | ICD-10-CM | POA: Diagnosis not present

## 2020-04-03 DIAGNOSIS — N183 Chronic kidney disease, stage 3 unspecified: Secondary | ICD-10-CM | POA: Diagnosis not present

## 2020-04-03 DIAGNOSIS — E039 Hypothyroidism, unspecified: Secondary | ICD-10-CM | POA: Diagnosis not present

## 2020-04-03 DIAGNOSIS — E1121 Type 2 diabetes mellitus with diabetic nephropathy: Secondary | ICD-10-CM | POA: Diagnosis not present

## 2020-04-03 DIAGNOSIS — E785 Hyperlipidemia, unspecified: Secondary | ICD-10-CM | POA: Diagnosis not present

## 2020-04-03 DIAGNOSIS — I5032 Chronic diastolic (congestive) heart failure: Secondary | ICD-10-CM | POA: Diagnosis not present

## 2020-04-14 ENCOUNTER — Ambulatory Visit (INDEPENDENT_AMBULATORY_CARE_PROVIDER_SITE_OTHER): Payer: Medicare HMO

## 2020-04-14 DIAGNOSIS — I442 Atrioventricular block, complete: Secondary | ICD-10-CM

## 2020-04-20 LAB — CUP PACEART REMOTE DEVICE CHECK
Battery Impedance: 4184 Ohm
Battery Remaining Longevity: 9 mo
Battery Voltage: 2.67 V
Brady Statistic AP VP Percent: 54 %
Brady Statistic AP VS Percent: 0 %
Brady Statistic AS VP Percent: 46 %
Brady Statistic AS VS Percent: 0 %
Date Time Interrogation Session: 20211101092847
Implantable Lead Implant Date: 20130107
Implantable Lead Implant Date: 20130107
Implantable Lead Location: 753859
Implantable Lead Location: 753860
Implantable Lead Model: 5076
Implantable Lead Model: 5092
Implantable Pulse Generator Implant Date: 20130107
Lead Channel Impedance Value: 492 Ohm
Lead Channel Impedance Value: 675 Ohm
Lead Channel Pacing Threshold Amplitude: 1.125 V
Lead Channel Pacing Threshold Amplitude: 1.125 V
Lead Channel Pacing Threshold Pulse Width: 0.4 ms
Lead Channel Pacing Threshold Pulse Width: 0.4 ms
Lead Channel Setting Pacing Amplitude: 2.25 V
Lead Channel Setting Pacing Amplitude: 2.5 V
Lead Channel Setting Pacing Pulse Width: 0.64 ms
Lead Channel Setting Sensing Sensitivity: 2 mV

## 2020-04-21 NOTE — Addendum Note (Signed)
Addended by: Douglass Rivers D on: 04/21/2020 10:54 AM   Modules accepted: Level of Service

## 2020-04-21 NOTE — Progress Notes (Signed)
Remote pacemaker transmission.   

## 2020-05-04 DIAGNOSIS — E782 Mixed hyperlipidemia: Secondary | ICD-10-CM | POA: Diagnosis not present

## 2020-05-04 DIAGNOSIS — E785 Hyperlipidemia, unspecified: Secondary | ICD-10-CM | POA: Diagnosis not present

## 2020-05-04 DIAGNOSIS — E1121 Type 2 diabetes mellitus with diabetic nephropathy: Secondary | ICD-10-CM | POA: Diagnosis not present

## 2020-05-04 DIAGNOSIS — E039 Hypothyroidism, unspecified: Secondary | ICD-10-CM | POA: Diagnosis not present

## 2020-05-04 DIAGNOSIS — M8580 Other specified disorders of bone density and structure, unspecified site: Secondary | ICD-10-CM | POA: Diagnosis not present

## 2020-05-04 DIAGNOSIS — E119 Type 2 diabetes mellitus without complications: Secondary | ICD-10-CM | POA: Diagnosis not present

## 2020-05-04 DIAGNOSIS — I5032 Chronic diastolic (congestive) heart failure: Secondary | ICD-10-CM | POA: Diagnosis not present

## 2020-05-04 DIAGNOSIS — D62 Acute posthemorrhagic anemia: Secondary | ICD-10-CM | POA: Diagnosis not present

## 2020-05-04 DIAGNOSIS — I10 Essential (primary) hypertension: Secondary | ICD-10-CM | POA: Diagnosis not present

## 2020-05-20 ENCOUNTER — Ambulatory Visit (INDEPENDENT_AMBULATORY_CARE_PROVIDER_SITE_OTHER): Payer: Medicare HMO

## 2020-05-20 DIAGNOSIS — I442 Atrioventricular block, complete: Secondary | ICD-10-CM

## 2020-05-22 LAB — CUP PACEART REMOTE DEVICE CHECK
Battery Impedance: 4265 Ohm
Battery Remaining Longevity: 9 mo
Battery Voltage: 2.65 V
Brady Statistic AP VP Percent: 54 %
Brady Statistic AP VS Percent: 0 %
Brady Statistic AS VP Percent: 46 %
Brady Statistic AS VS Percent: 0 %
Date Time Interrogation Session: 20211201093312
Implantable Lead Implant Date: 20130107
Implantable Lead Implant Date: 20130107
Implantable Lead Location: 753859
Implantable Lead Location: 753860
Implantable Lead Model: 5076
Implantable Lead Model: 5092
Implantable Pulse Generator Implant Date: 20130107
Lead Channel Impedance Value: 494 Ohm
Lead Channel Impedance Value: 638 Ohm
Lead Channel Pacing Threshold Amplitude: 1.125 V
Lead Channel Pacing Threshold Amplitude: 1.25 V
Lead Channel Pacing Threshold Pulse Width: 0.4 ms
Lead Channel Pacing Threshold Pulse Width: 0.4 ms
Lead Channel Setting Pacing Amplitude: 2.25 V
Lead Channel Setting Pacing Amplitude: 2.5 V
Lead Channel Setting Pacing Pulse Width: 0.64 ms
Lead Channel Setting Sensing Sensitivity: 2 mV

## 2020-05-27 NOTE — Progress Notes (Signed)
Remote pacemaker transmission.   

## 2020-05-27 NOTE — Addendum Note (Signed)
Addended by: Cheri Kearns A on: 05/27/2020 11:30 AM   Modules accepted: Level of Service

## 2020-06-01 DIAGNOSIS — E119 Type 2 diabetes mellitus without complications: Secondary | ICD-10-CM | POA: Diagnosis not present

## 2020-06-01 DIAGNOSIS — D62 Acute posthemorrhagic anemia: Secondary | ICD-10-CM | POA: Diagnosis not present

## 2020-06-01 DIAGNOSIS — I10 Essential (primary) hypertension: Secondary | ICD-10-CM | POA: Diagnosis not present

## 2020-06-01 DIAGNOSIS — E1121 Type 2 diabetes mellitus with diabetic nephropathy: Secondary | ICD-10-CM | POA: Diagnosis not present

## 2020-06-01 DIAGNOSIS — E785 Hyperlipidemia, unspecified: Secondary | ICD-10-CM | POA: Diagnosis not present

## 2020-06-01 DIAGNOSIS — E782 Mixed hyperlipidemia: Secondary | ICD-10-CM | POA: Diagnosis not present

## 2020-06-01 DIAGNOSIS — I5032 Chronic diastolic (congestive) heart failure: Secondary | ICD-10-CM | POA: Diagnosis not present

## 2020-06-01 DIAGNOSIS — E039 Hypothyroidism, unspecified: Secondary | ICD-10-CM | POA: Diagnosis not present

## 2020-06-01 DIAGNOSIS — N183 Chronic kidney disease, stage 3 unspecified: Secondary | ICD-10-CM | POA: Diagnosis not present

## 2020-06-17 DIAGNOSIS — E119 Type 2 diabetes mellitus without complications: Secondary | ICD-10-CM | POA: Diagnosis not present

## 2020-06-17 DIAGNOSIS — N183 Chronic kidney disease, stage 3 unspecified: Secondary | ICD-10-CM | POA: Diagnosis not present

## 2020-06-17 DIAGNOSIS — I442 Atrioventricular block, complete: Secondary | ICD-10-CM | POA: Diagnosis not present

## 2020-06-17 DIAGNOSIS — I10 Essential (primary) hypertension: Secondary | ICD-10-CM | POA: Diagnosis not present

## 2020-06-17 DIAGNOSIS — Z0000 Encounter for general adult medical examination without abnormal findings: Secondary | ICD-10-CM | POA: Diagnosis not present

## 2020-06-17 DIAGNOSIS — E1121 Type 2 diabetes mellitus with diabetic nephropathy: Secondary | ICD-10-CM | POA: Diagnosis not present

## 2020-06-17 DIAGNOSIS — E039 Hypothyroidism, unspecified: Secondary | ICD-10-CM | POA: Diagnosis not present

## 2020-06-17 DIAGNOSIS — I5032 Chronic diastolic (congestive) heart failure: Secondary | ICD-10-CM | POA: Diagnosis not present

## 2020-06-17 DIAGNOSIS — M8580 Other specified disorders of bone density and structure, unspecified site: Secondary | ICD-10-CM | POA: Diagnosis not present

## 2020-06-22 ENCOUNTER — Ambulatory Visit (INDEPENDENT_AMBULATORY_CARE_PROVIDER_SITE_OTHER): Payer: Medicare HMO

## 2020-06-22 DIAGNOSIS — I442 Atrioventricular block, complete: Secondary | ICD-10-CM

## 2020-06-23 LAB — CUP PACEART REMOTE DEVICE CHECK
Battery Impedance: 4814 Ohm
Battery Remaining Longevity: 6 mo
Battery Voltage: 2.68 V
Brady Statistic AP VP Percent: 55 %
Brady Statistic AP VS Percent: 0 %
Brady Statistic AS VP Percent: 45 %
Brady Statistic AS VS Percent: 0 %
Date Time Interrogation Session: 20220104110405
Implantable Lead Implant Date: 20130107
Implantable Lead Implant Date: 20130107
Implantable Lead Location: 753859
Implantable Lead Location: 753860
Implantable Lead Model: 5076
Implantable Lead Model: 5092
Implantable Pulse Generator Implant Date: 20130107
Lead Channel Impedance Value: 508 Ohm
Lead Channel Impedance Value: 656 Ohm
Lead Channel Pacing Threshold Amplitude: 1 V
Lead Channel Pacing Threshold Amplitude: 1.25 V
Lead Channel Pacing Threshold Pulse Width: 0.4 ms
Lead Channel Pacing Threshold Pulse Width: 0.4 ms
Lead Channel Setting Pacing Amplitude: 2.5 V
Lead Channel Setting Pacing Amplitude: 2.5 V
Lead Channel Setting Pacing Pulse Width: 0.64 ms
Lead Channel Setting Sensing Sensitivity: 2 mV

## 2020-06-24 DIAGNOSIS — Z0000 Encounter for general adult medical examination without abnormal findings: Secondary | ICD-10-CM | POA: Diagnosis not present

## 2020-06-24 DIAGNOSIS — M5416 Radiculopathy, lumbar region: Secondary | ICD-10-CM | POA: Diagnosis not present

## 2020-06-24 DIAGNOSIS — I10 Essential (primary) hypertension: Secondary | ICD-10-CM | POA: Diagnosis not present

## 2020-06-24 DIAGNOSIS — Z8719 Personal history of other diseases of the digestive system: Secondary | ICD-10-CM | POA: Diagnosis not present

## 2020-06-24 DIAGNOSIS — E039 Hypothyroidism, unspecified: Secondary | ICD-10-CM | POA: Diagnosis not present

## 2020-06-24 DIAGNOSIS — I5032 Chronic diastolic (congestive) heart failure: Secondary | ICD-10-CM | POA: Diagnosis not present

## 2020-06-24 DIAGNOSIS — N183 Chronic kidney disease, stage 3 unspecified: Secondary | ICD-10-CM | POA: Diagnosis not present

## 2020-06-24 DIAGNOSIS — F419 Anxiety disorder, unspecified: Secondary | ICD-10-CM | POA: Diagnosis not present

## 2020-06-24 DIAGNOSIS — E1121 Type 2 diabetes mellitus with diabetic nephropathy: Secondary | ICD-10-CM | POA: Diagnosis not present

## 2020-06-24 DIAGNOSIS — M8580 Other specified disorders of bone density and structure, unspecified site: Secondary | ICD-10-CM | POA: Diagnosis not present

## 2020-06-24 DIAGNOSIS — I442 Atrioventricular block, complete: Secondary | ICD-10-CM | POA: Diagnosis not present

## 2020-06-24 DIAGNOSIS — R8290 Unspecified abnormal findings in urine: Secondary | ICD-10-CM | POA: Diagnosis not present

## 2020-06-30 DIAGNOSIS — E119 Type 2 diabetes mellitus without complications: Secondary | ICD-10-CM | POA: Diagnosis not present

## 2020-06-30 DIAGNOSIS — E785 Hyperlipidemia, unspecified: Secondary | ICD-10-CM | POA: Diagnosis not present

## 2020-06-30 DIAGNOSIS — M8580 Other specified disorders of bone density and structure, unspecified site: Secondary | ICD-10-CM | POA: Diagnosis not present

## 2020-06-30 DIAGNOSIS — I10 Essential (primary) hypertension: Secondary | ICD-10-CM | POA: Diagnosis not present

## 2020-06-30 DIAGNOSIS — E1121 Type 2 diabetes mellitus with diabetic nephropathy: Secondary | ICD-10-CM | POA: Diagnosis not present

## 2020-06-30 DIAGNOSIS — E039 Hypothyroidism, unspecified: Secondary | ICD-10-CM | POA: Diagnosis not present

## 2020-06-30 DIAGNOSIS — I5032 Chronic diastolic (congestive) heart failure: Secondary | ICD-10-CM | POA: Diagnosis not present

## 2020-06-30 DIAGNOSIS — D62 Acute posthemorrhagic anemia: Secondary | ICD-10-CM | POA: Diagnosis not present

## 2020-06-30 DIAGNOSIS — E782 Mixed hyperlipidemia: Secondary | ICD-10-CM | POA: Diagnosis not present

## 2020-07-07 NOTE — Addendum Note (Signed)
Addended by: Cheri Kearns A on: 07/07/2020 10:48 AM   Modules accepted: Level of Service

## 2020-07-07 NOTE — Progress Notes (Signed)
Remote pacemaker transmission.   

## 2020-08-05 DIAGNOSIS — E1121 Type 2 diabetes mellitus with diabetic nephropathy: Secondary | ICD-10-CM | POA: Diagnosis not present

## 2020-08-05 DIAGNOSIS — N183 Chronic kidney disease, stage 3 unspecified: Secondary | ICD-10-CM | POA: Diagnosis not present

## 2020-08-05 DIAGNOSIS — M8580 Other specified disorders of bone density and structure, unspecified site: Secondary | ICD-10-CM | POA: Diagnosis not present

## 2020-08-05 DIAGNOSIS — I10 Essential (primary) hypertension: Secondary | ICD-10-CM | POA: Diagnosis not present

## 2020-08-05 DIAGNOSIS — Z0000 Encounter for general adult medical examination without abnormal findings: Secondary | ICD-10-CM | POA: Diagnosis not present

## 2020-08-05 DIAGNOSIS — I5032 Chronic diastolic (congestive) heart failure: Secondary | ICD-10-CM | POA: Diagnosis not present

## 2020-08-05 DIAGNOSIS — I442 Atrioventricular block, complete: Secondary | ICD-10-CM | POA: Diagnosis not present

## 2020-08-05 DIAGNOSIS — M5416 Radiculopathy, lumbar region: Secondary | ICD-10-CM | POA: Diagnosis not present

## 2020-08-05 DIAGNOSIS — E039 Hypothyroidism, unspecified: Secondary | ICD-10-CM | POA: Diagnosis not present

## 2020-08-23 NOTE — Progress Notes (Unsigned)
Cardiology Office Note   Date:  08/24/2020   ID:  Gabriela Phillips, DOB 11-16-1940, MRN [id number]  PCP:  Aretta Nip, MD    No chief complaint on file.  Chronic diastolic heart failure  Wt Readings from Last 3 Encounters:  08/24/20 156 lb (70.8 kg)  09/03/19 140 lb (63.5 kg)  08/27/19 140 lb (63.5 kg)       History of Present Illness: Gabriela Phillips is a 80 y.o. female  who has had a pacer placed. SHe has nonobstructive CAD by cath in 2008. SHe had some chest pain and then a negative stress test in 10/14.   In the past, it was noted: "Overall, she feels well. She remains active. She tries to eat healthy and watch portion sizes."   She has been vaccinated for COVID.  In 2021, She decreased the frequency of Lasix due to lack of swelling since weight loss.   She may need a battery change.      Past Medical History:  Diagnosis Date  . Anemia 2016  . CAD (coronary artery disease)    a. LHC (2/28): Severe HK at the apex, EF 40%, diffuse luminal irregularities, no significant CAD.;  b.  Lexiscan Myoview (2/13): EF 76%, normal perfusion; low-risk study;  c.  Lexiscan Myoview (10/14):  Low risk, no ischemia, EF 75%.  . Chronic systolic CHF (congestive heart failure) (HCC)    EF previously 40%; NICM; EF recovered;  Echo (03/2010): Normal LV function, EF 68.4%, apical AK, mild MR, mild TR, normal RV systolic pressure, diastolic dysfunction.  . CKD (chronic kidney disease), stage III ([place])   . Complete heart block ([place])   . Diabetes mellitus without complication ([place])   . Ejection fraction < 50%    68%  . Fatty liver   . Gastritis   . Hypertension   . Hypothyroidism   . Mild mitral regurgitation   . NICM (nonischemic cardiomyopathy) ([place])    non-ischemic; resolved EF 75%; GSPECT  . 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 Pre-diabetes   . Radiculopathy    followed by Dr. Krista Blue  . Syncope     Past Surgical History:  Procedure  Laterality Date  . BREAST ENHANCEMENT SURGERY    . CATARACT EXTRACTION    . DOPPLER ECHOCARDIOGRAPHY  03/2010   with normal left ventricular function, ejection fraction 68%, apical akinesis and mild mitral regurgitation  . ESOPHAGOGASTRODUODENOSCOPY    . INTRAOCULAR LENS INSERTION    . PACEMAKER GENERATOR CHANGE N/A 06/27/2011   Procedure: PACEMAKER GENERATOR CHANGE;  Surgeon: Evans Lance, MD;  Location: [facility] CATH LAB;  Service: Cardiovascular;  Laterality: N/A;  . PACEMAKER INSERTION  2005   complete heart block  . ROOT CANAL    . TOTAL ABDOMINAL HYSTERECTOMY W/ BILATERAL SALPINGOOPHORECTOMY  1989   X2  . UPPER GI ENDOSCOPY       Current Outpatient Medications  Medication Sig Dispense Refill  . atorvastatin (LIPITOR) 40 MG tablet Take 40 mg by mouth daily.    . carvedilol (COREG) 6.25 MG tablet Take 6.25 mg by mouth 2 (two) times daily with a meal.    . Cranberry 450 MG CAPS Take 1 capsule by mouth daily.    . furosemide (LASIX) 40 MG tablet Take 1 tablet (40 mg total) by mouth daily as needed. 90 tablet 3  . levocetirizine (XYZAL) 5 MG tablet Take 5 mg by mouth every evening.    Marland Kitchen  levothyroxine (SYNTHROID, LEVOTHROID) 75 MCG tablet Take 75 mcg by mouth daily.    Marland Kitchen LORazepam (ATIVAN) 1 MG tablet Take 1 mg by mouth 3 (three) times daily as needed for anxiety.    Marland Kitchen losartan (COZAAR) 100 MG tablet Take 100 mg by mouth daily.    . nitroGLYCERIN (NITROSTAT) 0.4 MG SL tablet Place 0.4 mg under the tongue every 5 (five) minutes as needed for chest pain.    . Omega-3 Fatty Acids (RA FISH OIL) 900 MG CAPS Take 4 capsules by mouth daily.    . potassium chloride SA (KLOR-CON) 20 MEQ tablet Take 1 tablet (20 mEq total) by mouth daily as needed. 90 tablet 3   No current facility-administered medications for this visit.    Allergies:   Crestor [rosuvastatin calcium], Nsaids, Lipitor [atorvastatin], and Penicillins    Social History:  The patient  reports that she quit smoking about 23 years  ago. She has never used smokeless tobacco. She reports that she does not drink alcohol and does not use drugs.   Family History:  The patient's family history includes Alcoholism in her father; Anemia in her mother; Diabetes in her father, mother, and sister; Heart Problems in her mother; Liver cancer in her father.    ROS:  Please see the history of present illness.   Otherwise, review of systems are positive for DOE.   All other systems are reviewed and negative.    PHYSICAL EXAM: VS:  BP 126/76   Pulse 79   Ht 5' (1.524 m)   Wt 156 lb (70.8 kg)   SpO2 94%   BMI 30.47 kg/m  , BMI Body mass index is 30.47 kg/m. GEN: Well nourished, well developed, in no acute distress  HEENT: normal  Neck: no JVD, carotid bruits, or masses Cardiac: RRR; no murmurs, rubs, or gallops,no edema  Respiratory:  clear to auscultation bilaterally, normal work of breathing GI: soft, nontender, nondistended, + BS MS: no deformity or atrophy  Skin: warm and dry, no rash Neuro:  Strength and sensation are intact Psych: euthymic mood, full affect   EKG:   The ekg ordered today demonstrates AV pacing   Recent Labs: No results found for requested labs within last 8760 hours.   Lipid Panel No results found for: CHOL, TRIG, HDL, CHOLHDL, VLDL, LDLCALC, LDLDIRECT   Other studies Reviewed: Additional studies/ records that were reviewed today with results demonstrating: LDL 82.   ASSESSMENT AND PLAN:  1. Chronic diastolic heart failure: Appears euvolemic.  Continue Lasix as needed.  She thinks she takes this about 3 to 4 days a week.  Low-salt diet.  She does have some shortness of breath but no apparent volume overload.  I think she has some deconditioning related to lack of activity.  I stressed the importance of making a commitment to walking more. 2. Hypertension: The current medical regimen is effective;  continue present plan and medications. 3. Pacer: Followed by Dr. Lovena Le. 4. Hyperlipidemia: The  current medical regimen is effective;  continue present plan and medications. 5. Prediabetes: A1c 6.5 on healthy diet.  Try to increase exercise as well.   Current medicines are reviewed at length with the patient today.  The patient concerns regarding her medicines were addressed.  The following changes have been made:  No change  Labs/ tests ordered today include:  No orders of the defined types were placed in this encounter.   Recommend 150 minutes/week of aerobic exercise Low fat, low carb, high fiber diet  recommended  Disposition:   FU in 1 year   Signed, Larae Grooms, MD  08/24/2020 1:23 PM    [facility] [place], [place], [place]  [address] Phone: [phone]; Fax: [phone]

## 2020-08-24 ENCOUNTER — Other Ambulatory Visit: Payer: Self-pay

## 2020-08-24 ENCOUNTER — Encounter (INDEPENDENT_AMBULATORY_CARE_PROVIDER_SITE_OTHER): Payer: Self-pay

## 2020-08-24 ENCOUNTER — Encounter: Payer: Self-pay | Admitting: Interventional Cardiology

## 2020-08-24 ENCOUNTER — Ambulatory Visit (INDEPENDENT_AMBULATORY_CARE_PROVIDER_SITE_OTHER): Payer: Medicare HMO

## 2020-08-24 ENCOUNTER — Ambulatory Visit: Payer: Medicare HMO | Admitting: Interventional Cardiology

## 2020-08-24 VITALS — BP 126/76 | HR 79 | Ht 60.0 in | Wt 156.0 lb

## 2020-08-24 DIAGNOSIS — I5032 Chronic diastolic (congestive) heart failure: Secondary | ICD-10-CM | POA: Diagnosis not present

## 2020-08-24 DIAGNOSIS — Z950 Presence of cardiac pacemaker: Secondary | ICD-10-CM | POA: Diagnosis not present

## 2020-08-24 DIAGNOSIS — I10 Essential (primary) hypertension: Secondary | ICD-10-CM | POA: Diagnosis not present

## 2020-08-24 DIAGNOSIS — E782 Mixed hyperlipidemia: Secondary | ICD-10-CM

## 2020-08-24 DIAGNOSIS — I442 Atrioventricular block, complete: Secondary | ICD-10-CM

## 2020-08-24 MED ORDER — FUROSEMIDE 40 MG PO TABS: 40.0000 mg | ORAL_TABLET | Freq: Every day | ORAL | 3 refills | Status: DC | PRN

## 2020-08-24 MED ORDER — POTASSIUM CHLORIDE CRYS ER 20 MEQ PO TBCR: 20.0000 meq | EXTENDED_RELEASE_TABLET | Freq: Every day | ORAL | 3 refills | Status: DC | PRN

## 2020-08-24 NOTE — Patient Instructions (Signed)

## 2020-08-25 LAB — CUP PACEART REMOTE DEVICE CHECK
Battery Impedance: 5525 Ohm
Battery Remaining Longevity: 3 mo
Battery Voltage: 2.62 V
Brady Statistic AP VP Percent: 54 %
Brady Statistic AP VS Percent: 0 %
Brady Statistic AS VP Percent: 46 %
Brady Statistic AS VS Percent: 0 %
Date Time Interrogation Session: 20220307102650
Implantable Lead Implant Date: 20130107
Implantable Lead Implant Date: 20130107
Implantable Lead Location: 753859
Implantable Lead Location: 753860
Implantable Lead Model: 5076
Implantable Lead Model: 5092
Implantable Pulse Generator Implant Date: 20130107
Lead Channel Impedance Value: 516 Ohm
Lead Channel Impedance Value: 675 Ohm
Lead Channel Pacing Threshold Amplitude: 1.125 V
Lead Channel Pacing Threshold Amplitude: 1.125 V
Lead Channel Pacing Threshold Pulse Width: 0.4 ms
Lead Channel Pacing Threshold Pulse Width: 0.4 ms
Lead Channel Setting Pacing Amplitude: 2.25 V
Lead Channel Setting Pacing Amplitude: 2.5 V
Lead Channel Setting Pacing Pulse Width: 0.64 ms
Lead Channel Setting Sensing Sensitivity: 2 mV

## 2020-09-02 NOTE — Addendum Note (Signed)
Addended by: Cheri Kearns A on: 09/02/2020 09:22 AM   Modules accepted: Level of Service

## 2020-09-02 NOTE — Progress Notes (Signed)
Remote pacemaker transmission.   

## 2020-09-08 ENCOUNTER — Other Ambulatory Visit: Payer: Self-pay

## 2020-09-08 ENCOUNTER — Encounter: Payer: Self-pay | Admitting: Internal Medicine

## 2020-09-08 ENCOUNTER — Ambulatory Visit: Payer: Medicare HMO | Admitting: Internal Medicine

## 2020-09-08 VITALS — BP 130/60 | HR 93 | Ht 60.0 in | Wt 152.2 lb

## 2020-09-08 DIAGNOSIS — I442 Atrioventricular block, complete: Secondary | ICD-10-CM | POA: Diagnosis not present

## 2020-09-08 DIAGNOSIS — I5032 Chronic diastolic (congestive) heart failure: Secondary | ICD-10-CM

## 2020-09-08 DIAGNOSIS — Z950 Presence of cardiac pacemaker: Secondary | ICD-10-CM

## 2020-09-08 DIAGNOSIS — R0602 Shortness of breath: Secondary | ICD-10-CM | POA: Diagnosis not present

## 2020-09-08 NOTE — Progress Notes (Signed)
HPI Gabriela Phillips returns today for followup. She is a pleasant 80 yo man with a h/o HTN, diastolic CHF, and CHB, s/p PPM insertion. She is just over a month out from ERI. She has not had chest pain or syncope. She notes more dyspnea. 4 years ago her echo EF was normal. Allergies  Allergen Reactions  . Crestor [Rosuvastatin Calcium]     CHEST TIGHTNESS, SOB  . Nsaids   . Lipitor [Atorvastatin]     MUSCLE ACHES  . Penicillins Other (See Comments)    Dark stool     Current Outpatient Medications  Medication Sig Dispense Refill  . atorvastatin (LIPITOR) 40 MG tablet Take 40 mg by mouth daily.    . carvedilol (COREG) 6.25 MG tablet Take 6.25 mg by mouth 2 (two) times daily with a meal.    . Cranberry 450 MG CAPS Take 1 capsule by mouth daily.    . furosemide (LASIX) 40 MG tablet Take 1 tablet (40 mg total) by mouth daily as needed. 90 tablet 3  . levocetirizine (XYZAL) 5 MG tablet Take 5 mg by mouth every evening.    Marland Kitchen levothyroxine (SYNTHROID, LEVOTHROID) 75 MCG tablet Take 75 mcg by mouth daily.    Marland Kitchen LORazepam (ATIVAN) 1 MG tablet Take 1 mg by mouth 3 (three) times daily as needed for anxiety.    Marland Kitchen losartan (COZAAR) 100 MG tablet Take 100 mg by mouth daily.    . nitroGLYCERIN (NITROSTAT) 0.4 MG SL tablet Place 0.4 mg under the tongue every 5 (five) minutes as needed for chest pain.    . Omega-3 Fatty Acids (RA FISH OIL) 900 MG CAPS Take 4 capsules by mouth daily.    . potassium chloride SA (KLOR-CON) 20 MEQ tablet Take 1 tablet (20 mEq total) by mouth daily as needed. 90 tablet 3   No current facility-administered medications for this visit.     Past Medical History:  Diagnosis Date  . Anemia 2016  . CAD (coronary artery disease)    a. LHC (2/28): Severe HK at the apex, EF 40%, diffuse luminal irregularities, no significant CAD.;  b.  Lexiscan Myoview (2/13): EF 76%, normal perfusion; low-risk study;  c.  Lexiscan Myoview (10/14):  Low risk, no ischemia, EF 75%.  .  Chronic systolic CHF (congestive heart failure) (HCC)    EF previously 40%; NICM; EF recovered;  Echo (03/2010): Normal LV function, EF 68.4%, apical AK, mild MR, mild TR, normal RV systolic pressure, diastolic dysfunction.  . CKD (chronic kidney disease), stage III ([place])   . Complete heart block ([place])   . Diabetes mellitus without complication ([place])   . Ejection fraction < 50%    68%  . Fatty liver   . Gastritis   . Hypertension   . Hypothyroidism   . Mild mitral regurgitation   . NICM (nonischemic cardiomyopathy) ([place])    non-ischemic; resolved EF 75%; GSPECT  . 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 Pre-diabetes   . Radiculopathy    followed by Dr. Krista Blue  . Syncope     ROS:   All systems reviewed and negative except as noted in the HPI.   Past Surgical History:  Procedure Laterality Date  . BREAST ENHANCEMENT SURGERY    . CATARACT EXTRACTION    . DOPPLER ECHOCARDIOGRAPHY  03/2010   with normal left ventricular function, ejection fraction 68%, apical akinesis and mild mitral regurgitation  . ESOPHAGOGASTRODUODENOSCOPY    .  INTRAOCULAR LENS INSERTION    . PACEMAKER GENERATOR CHANGE N/A 06/27/2011   Procedure: PACEMAKER GENERATOR CHANGE;  Surgeon: Evans Lance, MD;  Location: [facility] CATH LAB;  Service: Cardiovascular;  Laterality: N/A;  . PACEMAKER INSERTION  2005   complete heart block  . ROOT CANAL    . TOTAL ABDOMINAL HYSTERECTOMY W/ BILATERAL SALPINGOOPHORECTOMY  1989   X2  . UPPER GI ENDOSCOPY       Family History  Problem Relation Age of Onset  . Diabetes Mother   . Heart Problems Mother   . Anemia Mother   . Liver cancer Father   . Diabetes Father   . Alcoholism Father   . Diabetes Sister   . Heart attack Neg Hx   . Stroke Neg Hx   . Hypertension Neg Hx      Social History   Socioeconomic History  . Marital status: Married    Spouse name: Not on file  . Number of children: Not on file  . Years of education: Not on file  .  Highest education level: Not on file  Occupational History  . Not on file  Tobacco Use  . Smoking status: Former Smoker    Quit date: 06/23/1997    Years since quitting: 23.2  . Smokeless tobacco: Never Used  Substance and Sexual Activity  . Alcohol use: No  . Drug use: No  . Sexual activity: Not on file  Other Topics Concern  . Not on file  Social History Narrative  . Not on file   Social Determinants of Health   Financial Resource Strain: Not on file  Food Insecurity: Not on file  Transportation Needs: Not on file  Physical Activity: Not on file  Stress: Not on file  Social Connections: Not on file  Intimate Partner Violence: Not on file     BP 130/60   Pulse 93   Ht 5' (1.524 m)   Wt 152 lb 3.2 oz (69 kg)   SpO2 95%   BMI 29.72 kg/m   Physical Exam:  Well appearing NAD HEENT: Unremarkable Neck:  No JVD, no thyromegally Lymphatics:  No adenopathy Back:  No CVA tenderness Lungs:  Clear HEART:  Regular rate rhythm, no murmurs, no rubs, no clicks Abd:  soft, positive bowel sounds, no organomegally, no rebound, no guarding Ext:  2 plus pulses, no edema, no cyanosis, no clubbing Skin:  No rashes no nodules Neuro:  CN II through XII intact, motor grossly intact  EKG - nsr with ventricular pacing  DEVICE  Normal device function.  See PaceArt for details.   Assess/Plan: 1. Chronic diastolic heart failure - she is class 2 and doing well. We will check a 2D echo. 2. HTN - her sbp is minimally elevated. She is encouraged to avoid salty food. 3. CHB - she is asymptomatic, s/p PPM insertion.  4. PPM - her medtronic DDD PM is working normally and is about a month from change out.  Carleene Overlie Taylor,MD

## 2020-09-08 NOTE — Patient Instructions (Addendum)
Medication Instructions:  Your physician recommends that you continue on your current medications as directed. Please refer to the Current Medication list given to you today.  Labwork: None ordered.  Testing/Procedures: Your physician has requested that you have an echocardiogram. Echocardiography is a painless test that uses sound waves to create images of your heart. It provides your doctor with information about the size and shape of your heart and how well your heart's chambers and valves are working. This procedure takes approximately one hour. There are no restrictions for this procedure.  Please schedule ECHO  Follow-Up:  We will get you scheduled for a generator change after your ECHO.  April 18, 20, 21  Any Other Special Instructions Will Be Listed Below (If Applicable).  If you need a refill on your cardiac medications before your next appointment, please call your pharmacy.

## 2020-09-14 LAB — CUP PACEART INCLINIC DEVICE CHECK
Battery Impedance: 6036 Ohm
Battery Remaining Longevity: 1 mo
Battery Voltage: 2.59 V
Brady Statistic AP VP Percent: 53 %
Brady Statistic AP VS Percent: 0 %
Brady Statistic AS VP Percent: 47 %
Brady Statistic AS VS Percent: 0 %
Brady Statistic RV Percent Paced: 100 %
Date Time Interrogation Session: 20220322211949
Implantable Lead Implant Date: 20130107
Implantable Lead Implant Date: 20130107
Implantable Lead Location: 753859
Implantable Lead Location: 753860
Implantable Lead Model: 5076
Implantable Lead Model: 5092
Implantable Pulse Generator Implant Date: 20130107
Lead Channel Impedance Value: 480 Ohm
Lead Channel Impedance Value: 660 Ohm
Lead Channel Pacing Threshold Amplitude: 0.75 V
Lead Channel Pacing Threshold Amplitude: 1 V
Lead Channel Pacing Threshold Pulse Width: 0.4 ms
Lead Channel Pacing Threshold Pulse Width: 0.64 ms
Lead Channel Sensing Intrinsic Amplitude: 2 mV
Lead Channel Sensing Intrinsic Amplitude: 4 mV
Lead Channel Setting Pacing Amplitude: 2.5 V
Lead Channel Setting Pacing Amplitude: 2.5 V
Lead Channel Setting Pacing Pulse Width: 0.64 ms
Lead Channel Setting Sensing Sensitivity: 2 mV

## 2020-09-16 DIAGNOSIS — I10 Essential (primary) hypertension: Secondary | ICD-10-CM | POA: Diagnosis not present

## 2020-09-16 DIAGNOSIS — Z950 Presence of cardiac pacemaker: Secondary | ICD-10-CM | POA: Diagnosis not present

## 2020-09-16 DIAGNOSIS — F411 Generalized anxiety disorder: Secondary | ICD-10-CM | POA: Diagnosis not present

## 2020-09-16 DIAGNOSIS — I442 Atrioventricular block, complete: Secondary | ICD-10-CM | POA: Diagnosis not present

## 2020-09-17 ENCOUNTER — Telehealth: Payer: Self-pay

## 2020-09-17 DIAGNOSIS — I10 Essential (primary) hypertension: Secondary | ICD-10-CM | POA: Diagnosis not present

## 2020-09-17 DIAGNOSIS — E039 Hypothyroidism, unspecified: Secondary | ICD-10-CM | POA: Diagnosis not present

## 2020-09-17 DIAGNOSIS — E119 Type 2 diabetes mellitus without complications: Secondary | ICD-10-CM | POA: Diagnosis not present

## 2020-09-17 DIAGNOSIS — Z950 Presence of cardiac pacemaker: Secondary | ICD-10-CM

## 2020-09-17 DIAGNOSIS — E1121 Type 2 diabetes mellitus with diabetic nephropathy: Secondary | ICD-10-CM | POA: Diagnosis not present

## 2020-09-17 DIAGNOSIS — I5032 Chronic diastolic (congestive) heart failure: Secondary | ICD-10-CM

## 2020-09-17 DIAGNOSIS — D62 Acute posthemorrhagic anemia: Secondary | ICD-10-CM | POA: Diagnosis not present

## 2020-09-17 DIAGNOSIS — E782 Mixed hyperlipidemia: Secondary | ICD-10-CM | POA: Diagnosis not present

## 2020-09-17 DIAGNOSIS — M8580 Other specified disorders of bone density and structure, unspecified site: Secondary | ICD-10-CM | POA: Diagnosis not present

## 2020-09-17 DIAGNOSIS — I442 Atrioventricular block, complete: Secondary | ICD-10-CM

## 2020-09-17 DIAGNOSIS — E785 Hyperlipidemia, unspecified: Secondary | ICD-10-CM | POA: Diagnosis not present

## 2020-09-17 NOTE — Telephone Encounter (Signed)
Attempted to call Pt.  No VM.  Will plan for gen change on 10/19/2020 depending on results of echo 10/06/20

## 2020-09-24 ENCOUNTER — Ambulatory Visit (INDEPENDENT_AMBULATORY_CARE_PROVIDER_SITE_OTHER): Payer: Medicare HMO

## 2020-09-24 DIAGNOSIS — S83409A Sprain of unspecified collateral ligament of unspecified knee, initial encounter: Secondary | ICD-10-CM | POA: Diagnosis not present

## 2020-09-24 DIAGNOSIS — I442 Atrioventricular block, complete: Secondary | ICD-10-CM

## 2020-09-24 DIAGNOSIS — M25561 Pain in right knee: Secondary | ICD-10-CM | POA: Diagnosis not present

## 2020-09-24 LAB — CUP PACEART REMOTE DEVICE CHECK
Battery Impedance: 7065 Ohm
Battery Voltage: 2.63 V
Brady Statistic RV Percent Paced: 100 %
Date Time Interrogation Session: 20220407074516
Implantable Lead Implant Date: 20130107
Implantable Lead Implant Date: 20130107
Implantable Lead Location: 753859
Implantable Lead Location: 753860
Implantable Lead Model: 5076
Implantable Lead Model: 5092
Implantable Pulse Generator Implant Date: 20130107
Lead Channel Impedance Value: 659 Ohm
Lead Channel Impedance Value: 67 Ohm
Lead Channel Setting Pacing Amplitude: 2.5 V
Lead Channel Setting Pacing Pulse Width: 0.64 ms
Lead Channel Setting Sensing Sensitivity: 2 mV

## 2020-09-25 DIAGNOSIS — H52203 Unspecified astigmatism, bilateral: Secondary | ICD-10-CM | POA: Diagnosis not present

## 2020-09-25 DIAGNOSIS — Z961 Presence of intraocular lens: Secondary | ICD-10-CM | POA: Diagnosis not present

## 2020-09-25 DIAGNOSIS — E119 Type 2 diabetes mellitus without complications: Secondary | ICD-10-CM | POA: Diagnosis not present

## 2020-09-29 NOTE — Telephone Encounter (Signed)
Outreach made to Pt.  Advised procedure would be scheduled on May 2.  Awaiting results of echo to determine if gen change vs. Upgrade.  Pt will get labs same day as echo.  Will continue to monitor.

## 2020-10-06 ENCOUNTER — Other Ambulatory Visit: Payer: Medicare HMO | Admitting: *Deleted

## 2020-10-06 ENCOUNTER — Other Ambulatory Visit: Payer: Self-pay

## 2020-10-06 ENCOUNTER — Ambulatory Visit (HOSPITAL_COMMUNITY): Payer: Medicare HMO | Attending: Cardiovascular Disease

## 2020-10-06 DIAGNOSIS — R0602 Shortness of breath: Secondary | ICD-10-CM | POA: Diagnosis not present

## 2020-10-06 DIAGNOSIS — I5032 Chronic diastolic (congestive) heart failure: Secondary | ICD-10-CM | POA: Diagnosis not present

## 2020-10-06 DIAGNOSIS — I442 Atrioventricular block, complete: Secondary | ICD-10-CM | POA: Diagnosis not present

## 2020-10-06 DIAGNOSIS — Z950 Presence of cardiac pacemaker: Secondary | ICD-10-CM

## 2020-10-07 LAB — CBC WITH DIFFERENTIAL/PLATELET
Basophils Absolute: 0.1 10*3/uL (ref 0.0–0.2)
Basos: 1 %
EOS (ABSOLUTE): 0.3 10*3/uL (ref 0.0–0.4)
Eos: 4 %
Hematocrit: 37.6 % (ref 34.0–46.6)
Hemoglobin: 12.7 g/dL (ref 11.1–15.9)
Immature Grans (Abs): 0 10*3/uL (ref 0.0–0.1)
Immature Granulocytes: 0 %
Lymphocytes Absolute: 2.4 10*3/uL (ref 0.7–3.1)
Lymphs: 36 %
MCH: 32.2 pg (ref 26.6–33.0)
MCHC: 33.8 g/dL (ref 31.5–35.7)
MCV: 95 fL (ref 79–97)
Monocytes Absolute: 0.5 10*3/uL (ref 0.1–0.9)
Monocytes: 8 %
Neutrophils Absolute: 3.4 10*3/uL (ref 1.4–7.0)
Neutrophils: 51 %
Platelets: 251 10*3/uL (ref 150–450)
RBC: 3.94 x10E6/uL (ref 3.77–5.28)
RDW: 13 % (ref 11.7–15.4)
WBC: 6.7 10*3/uL (ref 3.4–10.8)

## 2020-10-07 LAB — BASIC METABOLIC PANEL
BUN/Creatinine Ratio: 19 (ref 12–28)
BUN: 23 mg/dL (ref 8–27)
CO2: 23 mmol/L (ref 20–29)
Calcium: 9.5 mg/dL (ref 8.7–10.3)
Chloride: 106 mmol/L (ref 96–106)
Creatinine, Ser: 1.19 mg/dL — ABNORMAL HIGH (ref 0.57–1.00)
Glucose: 132 mg/dL — ABNORMAL HIGH (ref 65–99)
Potassium: 4.3 mmol/L (ref 3.5–5.2)
Sodium: 143 mmol/L (ref 134–144)
eGFR: 47 mL/min/{1.73_m2} — ABNORMAL LOW (ref >59)

## 2020-10-07 NOTE — Telephone Encounter (Signed)
Echo results given to family.  No upgrade needed.  Will continue with plan for gen change.

## 2020-10-07 NOTE — Progress Notes (Signed)
Remote pacemaker transmission.   

## 2020-10-09 NOTE — Telephone Encounter (Signed)
Call placed to Pt's husband.  Confirmed instructions for 5/2 gen change\  Work up complete

## 2020-10-16 ENCOUNTER — Other Ambulatory Visit (HOSPITAL_COMMUNITY)
Admission: RE | Admit: 2020-10-16 | Discharge: 2020-10-16 | Disposition: A | Discharge status: Home / Self Care | Payer: Medicare HMO | Source: Ambulatory Visit | Attending: Internal Medicine | Admitting: Internal Medicine

## 2020-10-16 DIAGNOSIS — Z20822 Contact with and (suspected) exposure to covid-19: Secondary | ICD-10-CM | POA: Diagnosis not present

## 2020-10-16 DIAGNOSIS — Z01812 Encounter for preprocedural laboratory examination: Secondary | ICD-10-CM | POA: Diagnosis not present

## 2020-10-16 LAB — SARS CORONAVIRUS 2 (TAT 6-24 HRS): SARS Coronavirus 2: NEGATIVE

## 2020-10-19 ENCOUNTER — Ambulatory Visit (HOSPITAL_COMMUNITY)
Admission: RE | Disposition: A | Discharge status: Home / Self Care | Payer: Medicare HMO | Source: Home / Self Care | Attending: Internal Medicine

## 2020-10-19 ENCOUNTER — Ambulatory Visit (HOSPITAL_COMMUNITY)
Admission: RE | Admit: 2020-10-19 | Discharge: 2020-10-19 | Disposition: A | Discharge status: Home / Self Care | Payer: Medicare HMO | Attending: Internal Medicine | Admitting: Internal Medicine

## 2020-10-19 ENCOUNTER — Other Ambulatory Visit: Payer: Self-pay

## 2020-10-19 DIAGNOSIS — K760 Fatty (change of) liver, not elsewhere classified: Secondary | ICD-10-CM | POA: Insufficient documentation

## 2020-10-19 DIAGNOSIS — I130 Hypertensive heart and chronic kidney disease with heart failure and stage 1 through stage 4 chronic kidney disease, or unspecified chronic kidney disease: Secondary | ICD-10-CM | POA: Diagnosis not present

## 2020-10-19 DIAGNOSIS — N183 Chronic kidney disease, stage 3 unspecified: Secondary | ICD-10-CM | POA: Insufficient documentation

## 2020-10-19 DIAGNOSIS — Z90710 Acquired absence of both cervix and uterus: Secondary | ICD-10-CM | POA: Insufficient documentation

## 2020-10-19 DIAGNOSIS — Z8379 Family history of other diseases of the digestive system: Secondary | ICD-10-CM | POA: Diagnosis not present

## 2020-10-19 DIAGNOSIS — E039 Hypothyroidism, unspecified: Secondary | ICD-10-CM | POA: Diagnosis not present

## 2020-10-19 DIAGNOSIS — E1122 Type 2 diabetes mellitus with diabetic chronic kidney disease: Secondary | ICD-10-CM | POA: Diagnosis not present

## 2020-10-19 DIAGNOSIS — Z8249 Family history of ischemic heart disease and other diseases of the circulatory system: Secondary | ICD-10-CM | POA: Insufficient documentation

## 2020-10-19 DIAGNOSIS — Z87891 Personal history of nicotine dependence: Secondary | ICD-10-CM | POA: Insufficient documentation

## 2020-10-19 DIAGNOSIS — I340 Nonrheumatic mitral (valve) insufficiency: Secondary | ICD-10-CM | POA: Diagnosis not present

## 2020-10-19 DIAGNOSIS — I442 Atrioventricular block, complete: Secondary | ICD-10-CM | POA: Diagnosis not present

## 2020-10-19 DIAGNOSIS — I428 Other cardiomyopathies: Secondary | ICD-10-CM | POA: Diagnosis not present

## 2020-10-19 DIAGNOSIS — Z833 Family history of diabetes mellitus: Secondary | ICD-10-CM | POA: Insufficient documentation

## 2020-10-19 DIAGNOSIS — I5022 Chronic systolic (congestive) heart failure: Secondary | ICD-10-CM | POA: Insufficient documentation

## 2020-10-19 DIAGNOSIS — I2510 Atherosclerotic heart disease of native coronary artery without angina pectoris: Secondary | ICD-10-CM | POA: Diagnosis not present

## 2020-10-19 DIAGNOSIS — Z79899 Other long term (current) drug therapy: Secondary | ICD-10-CM | POA: Diagnosis not present

## 2020-10-19 DIAGNOSIS — Z886 Allergy status to analgesic agent status: Secondary | ICD-10-CM | POA: Insufficient documentation

## 2020-10-19 DIAGNOSIS — Z45010 Encounter for checking and testing of cardiac pacemaker pulse generator [battery]: Secondary | ICD-10-CM | POA: Diagnosis not present

## 2020-10-19 DIAGNOSIS — Z90722 Acquired absence of ovaries, bilateral: Secondary | ICD-10-CM | POA: Diagnosis not present

## 2020-10-19 DIAGNOSIS — Z79890 Hormone replacement therapy (postmenopausal): Secondary | ICD-10-CM | POA: Diagnosis not present

## 2020-10-19 DIAGNOSIS — Z880 Allergy status to penicillin: Secondary | ICD-10-CM | POA: Insufficient documentation

## 2020-10-19 DIAGNOSIS — Z888 Allergy status to other drugs, medicaments and biological substances status: Secondary | ICD-10-CM | POA: Insufficient documentation

## 2020-10-19 HISTORY — PX: PPM GENERATOR CHANGEOUT: EP1233

## 2020-10-19 SURGERY — PPM GENERATOR CHANGEOUT

## 2020-10-19 MED ORDER — LIDOCAINE HCL (PF) 1 % IJ SOLN
INTRAMUSCULAR | Status: DC | PRN
  Administered 2020-10-19: 50 mL

## 2020-10-19 MED ORDER — LIDOCAINE HCL 1 % IJ SOLN
INTRAMUSCULAR | Status: AC
  Filled 2020-10-19: qty 60

## 2020-10-19 MED ORDER — SODIUM CHLORIDE 0.9 % IV SOLN
80.0000 mg | INTRAVENOUS | Status: AC
  Administered 2020-10-19: 80 mg

## 2020-10-19 MED ORDER — POVIDONE-IODINE 10 % EX SWAB: 2.0000 "application " | Freq: Once | CUTANEOUS | Status: DC

## 2020-10-19 MED ORDER — SODIUM CHLORIDE 0.9 % IV SOLN: INTRAVENOUS | Status: DC

## 2020-10-19 MED ORDER — CHLORHEXIDINE GLUCONATE 4 % EX LIQD: 4.0000 "application " | Freq: Once | CUTANEOUS | Status: DC

## 2020-10-19 MED ORDER — VANCOMYCIN HCL 1000 MG/200ML IV SOLN
1000.0000 mg | INTRAVENOUS | Status: AC
  Administered 2020-10-19: 1000 mg via INTRAVENOUS
  Filled 2020-10-19: qty 200

## 2020-10-19 MED ORDER — VANCOMYCIN HCL 1000 MG/200ML IV SOLN
1000.0000 mg | INTRAVENOUS | Status: DC
Start: 2020-10-19 — End: 2020-10-19

## 2020-10-19 MED ORDER — ACETAMINOPHEN 325 MG PO TABS
325.0000 mg | ORAL_TABLET | ORAL | Status: DC | PRN
  Filled 2020-10-19: qty 2

## 2020-10-19 MED ORDER — ONDANSETRON HCL 4 MG/2ML IJ SOLN: 4.0000 mg | Freq: Four times a day (QID) | INTRAMUSCULAR | Status: DC | PRN

## 2020-10-19 MED ORDER — SODIUM CHLORIDE 0.9 % IV SOLN
INTRAVENOUS | Status: AC
  Filled 2020-10-19: qty 2

## 2020-10-19 MED ORDER — VANCOMYCIN HCL IN DEXTROSE 1-5 GM/200ML-% IV SOLN
INTRAVENOUS | Status: AC
  Filled 2020-10-19: qty 200

## 2020-10-19 SURGICAL SUPPLY — 5 items
CABLE SURGICAL S-101-97-12 (CABLE) ×2 IMPLANT
IPG PACE AZUR XT DR MRI W1DR01 (Pacemaker) IMPLANT
PACE AZURE XT DR MRI W1DR01 (Pacemaker) ×2 IMPLANT
PAD PRO RADIOLUCENT 2001M-C (PAD) ×2 IMPLANT
TRAY PACEMAKER INSERTION (PACKS) ×2 IMPLANT

## 2020-10-19 NOTE — H&P (Signed)
HPI [name] returns today for followup. She is a pleasant 80 yo man with a h/o HTN, diastolic CHF, and CHB, s/p PPM insertion. She is just over a month out from ERI. She has not had chest pain or syncope.She notes more dyspnea. 4 years ago her echo EF was normal.      Allergies  Allergen Reactions  . Crestor [Rosuvastatin Calcium]     CHEST TIGHTNESS, SOB  . Nsaids   . Lipitor [Atorvastatin]     MUSCLE ACHES  . Penicillins Other (See Comments)    Dark stool           Current Outpatient Medications  Medication Sig Dispense Refill  . atorvastatin (LIPITOR) 40 MG tablet Take 40 mg by mouth daily.    . carvedilol (COREG) 6.25 MG tablet Take 6.25 mg by mouth 2 (two) times daily with a meal.    . Cranberry 450 MG CAPS Take 1 capsule by mouth daily.    . furosemide (LASIX) 40 MG tablet Take 1 tablet (40 mg total) by mouth daily as needed. 90 tablet 3  . levocetirizine (XYZAL) 5 MG tablet Take 5 mg by mouth every evening.    Marland Kitchen levothyroxine (SYNTHROID, LEVOTHROID) 75 MCG tablet Take 75 mcg by mouth daily.    Marland Kitchen LORazepam (ATIVAN) 1 MG tablet Take 1 mg by mouth 3 (three) times daily as needed for anxiety.    Marland Kitchen losartan (COZAAR) 100 MG tablet Take 100 mg by mouth daily.    . nitroGLYCERIN (NITROSTAT) 0.4 MG SL tablet Place 0.4 mg under the tongue every 5 (five) minutes as needed for chest pain.    . Omega-3 Fatty Acids (RA FISH OIL) 900 MG CAPS Take 4 capsules by mouth daily.    . potassium chloride SA (KLOR-CON) 20 MEQ tablet Take 1 tablet (20 mEq total) by mouth daily as needed. 90 tablet 3   No current facility-administered medications for this visit.         Past Medical History:  Diagnosis Date  . Anemia 2016  . CAD (coronary artery disease)    a. LHC (2/28): Severe HK at the apex, EF 40%, diffuse luminal irregularities, no significant CAD.;  b.  Lexiscan Myoview (2/13): EF 76%, normal perfusion; low-risk study;  c.  Lexiscan  Myoview (10/14):  Low risk, no ischemia, EF 75%.  . Chronic systolic CHF (congestive heart failure) (HCC)    EF previously 40%; NICM; EF recovered;  Echo (03/2010): Normal LV function, EF 68.4%, apical AK, mild MR, mild TR, normal RV systolic pressure, diastolic dysfunction.  . CKD (chronic kidney disease), stage III ([place])   . Complete heart block ([place])   . Diabetes mellitus without complication ([place])   . Ejection fraction < 50%    68%  . Fatty liver   . Gastritis   . Hypertension   . Hypothyroidism   . Mild mitral regurgitation   . NICM (nonischemic cardiomyopathy) ([place])    non-ischemic; resolved EF 75%; GSPECT  . 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 Pre-diabetes   . Radiculopathy    followed by Dr. Krista Blue  . Syncope     ROS:   All systems reviewed and negative except as noted in the HPI.        Past Surgical History:  Procedure Laterality Date  . BREAST ENHANCEMENT SURGERY    . CATARACT EXTRACTION    . DOPPLER ECHOCARDIOGRAPHY  03/2010   with  normal left ventricular function, ejection fraction 68%, apical akinesis and mild mitral regurgitation  . ESOPHAGOGASTRODUODENOSCOPY    . INTRAOCULAR LENS INSERTION    . PACEMAKER GENERATOR CHANGE N/A 06/27/2011   Procedure: PACEMAKER GENERATOR CHANGE;  Surgeon: Evans Lance, MD;  [facility] C[facility] LAB;  Service: Cardiovascular;  Laterality: N/A;  . PACEMAKER INSERTION  2005   complete heart block  . ROOT CANAL    . TOTAL ABDOMINAL HYSTERECTOMY W/ BILATERAL SALPINGOOPHORECTOMY  1989   X2  . UPPER GI ENDOSCOPY            Family History  Problem Relation Age of Onset  . Diabetes Mother   . Heart Problems Mother   . Anemia Mother   . Liver cancer Father   . Diabetes Father   . Alcoholism Father   . Diabetes Sister   . Heart attack Neg Hx   . Stroke Neg Hx   . Hypertension Neg Hx      Social History        Socioeconomic History  .  Marital status: Married    Spouse name: Not on file  . Number of children: Not on file  . Years of education: Not on file  . Highest education level: Not on file  Occupational History  . Not on file  Tobacco Use  . Smoking status: Former Smoker    Quit date: 06/23/1997    Years since quitting: 23.2  . Smokeless tobacco: Never Used  Substance and Sexual Activity  . Alcohol use: No  . Drug use: No  . Sexual activity: Not on file  Other Topics Concern  . Not on file  Social History Narrative  . Not on file   Social Determinants of Health   Financial Resource Strain: Not on file  Food Insecurity: Not on file  Transportation Needs: Not on file  Physical Activity: Not on file  Stress: Not on file  Social Connections: Not on file  Intimate Partner Violence: Not on file     BP 130/60   Pulse 93   Ht 5' (1.524 m)   Wt 152 lb 3.2 oz (69 kg)   SpO2 95%   BMI 29.72 kg/m   Physical Exam:  Well appearing NAD HEENT: Unremarkable Neck:  No JVD, no thyromegally Lymphatics:  No adenopathy Back:  No CVA tenderness Lungs:  Clear HEART:  Regular rate rhythm, no murmurs, no rubs, no clicks Abd:  soft, positive bowel sounds, no organomegally, no rebound, no guarding Ext:  2 plus pulses, no edema, no cyanosis, no clubbing Skin:  No rashes no nodules Neuro:  CN II through XII intact, motor grossly intact  EKG - nsr with ventricular pacing  DEVICE  Normal device function.  See PaceArt for details.   Assess/Plan: 1. Chronic diastolic heart failure - she is class 2 and doing well. We will check a 2D echo. 2. HTN - her sbp is minimally elevated. She is encouraged to avoid salty food. 3. CHB - she is asymptomatic, s/p PPM insertion.  4. PPM - her medtronic DDD PM is working normally and is about a month from change out.  Salome Spotted  EP Attending  Patient seen and examined. Agree with above. Her 2D echo demonstrates normal LV function. She will not require  biv upgrade. I have discussed the indications/risks/benefit/goals/expectations of PPM gen change out and she wishes to proceed.  Carleene Overlie Lakeithia Rasor,MD

## 2020-10-19 NOTE — Progress Notes (Signed)
Pt ambulated without difficulty or bleeding.     Medtronic saw pt before discharge.  Discharged home with husband who will drive and stay with pt x 24 hrs

## 2020-10-19 NOTE — Discharge Instructions (Signed)
Implantable Cardiac Device Battery Change, Care After REMOVE OUTER DRESSING IN 24 HOURS KEEP SITE DRY TILL WOUND CHECK  This sheet gives you information about how to care for yourself after your procedure. Your health care provider may also give you more specific instructions. If you have problems or questions, contact your health care provider. What can I expect after the procedure? After your procedure, it is common to have:  Pain or soreness at the site where the cardiac device was inserted.  Swelling at the site where the cardiac device was inserted.  You should received an information card for your new device in 4-8 weeks. Follow these instructions at home: Incision care   Keep the incision clean and dry. ? Do not take baths, swim, or use a hot tub until after your wound check.  ? Do not shower for at least 7 days, or as directed by your health care provider. ? Pat the area dry with a clean towel. Do not rub the area. This may cause bleeding.  Follow instructions from your health care provider about how to take care of your incision. Make sure you: ? Leave stitches (sutures), skin glue, or adhesive strips in place. These skin closures may need to stay in place for 2 weeks or longer. If adhesive strip edges start to loosen and curl up, you may trim the loose edges. Do not remove adhesive strips completely unless your health care provider tells you to do that.  Check your incision area every day for signs of infection. Check for: ? More redness, swelling, or pain. ? More fluid or blood. ? Warmth. ? Pus or a bad smell. Activity  Do not lift anything that is heavier than 10 lb (4.5 kg) until your health care provider says it is okay to do so.  For the first week, or as long as told by your health care provider: ? Avoid lifting your affected arm higher than your shoulder. ? After 1 week, Be gentle when you move your arms over your head. It is okay to raise your arm to comb your  hair. ? Avoid strenuous exercise.  Ask your health care provider when it is okay to: ? Resume your normal activities. ? Return to work or school. ? Resume sexual activity. Eating and drinking  Eat a heart-healthy diet. This should include plenty of fresh fruits and vegetables, whole grains, low-fat dairy products, and lean protein like chicken and fish.  Limit alcohol intake to no more than 1 drink a day for non-pregnant women and 2 drinks a day for men. One drink equals 12 oz of beer, 5 oz of wine, or 1 oz of hard liquor.  Check ingredients and nutrition facts on packaged foods and beverages. Avoid the following types of food: ? Food that is high in salt (sodium). ? Food that is high in saturated fat, like full-fat dairy or red meat. ? Food that is high in trans fat, like fried food. ? Food and drinks that are high in sugar. Lifestyle  Do not use any products that contain nicotine or tobacco, such as cigarettes and e-cigarettes. If you need help quitting, ask your health care provider.  Take steps to manage and control your weight.  Once cleared, get regular exercise. Aim for 150 minutes of moderate-intensity exercise (such as walking or yoga) or 75 minutes of vigorous exercise (such as running or swimming) each week.  Manage other health problems, such as diabetes or high blood pressure. Ask your health  care provider how you can manage these conditions. General instructions  Do not drive for 24 hours after your procedure if you were given a medicine to help you relax (sedative).  Take over-the-counter and prescription medicines only as told by your health care provider.  Avoid putting pressure on the area where the cardiac device was placed.  If you need an MRI after your cardiac device has been placed, be sure to tell the health care provider who orders the MRI that you have a cardiac device.  Avoid close and prolonged exposure to electrical devices that have strong magnetic  fields. These include: ? Cell phones. Avoid keeping them in a pocket near the cardiac device, and try using the ear opposite the cardiac device. ? MP3 players. ? Household appliances, like microwaves. ? Metal detectors. ? Electric generators. ? High-tension wires.  Keep all follow-up visits as directed by your health care provider. This is important. Contact a health care provider if:  You have pain at the incision site that is not relieved by over-the-counter or prescription medicines.  You have any of these around your incision site or coming from it: ? More redness, swelling, or pain. ? Fluid or blood. ? Warmth to the touch. ? Pus or a bad smell.  You have a fever.  You feel brief, occasional palpitations, light-headedness, or any symptoms that you think might be related to your heart. Get help right away if:  You experience chest pain that is different from the pain at the cardiac device site.  You develop a red streak that extends above or below the incision site.  You experience shortness of breath.  You have palpitations or an irregular heartbeat.  You have light-headedness that does not go away quickly.  You faint or have dizzy spells.  Your pulse suddenly drops or increases rapidly and does not return to normal.  You begin to gain weight and your legs and ankles swell. Summary  After your procedure, it is common to have pain, soreness, and some swelling where the cardiac device was inserted.  Make sure to keep your incision clean and dry. Follow instructions from your health care provider about how to take care of your incision.  Check your incision every day for signs of infection, such as more pain or swelling, pus or a bad smell, warmth, or leaking fluid and blood.  Avoid strenuous exercise and lifting your left arm higher than your shoulder for 2 weeks, or as long as told by your health care provider. This information is not intended to replace advice  given to you by your health care provider. Make sure you discuss any questions you have with your health care provider.

## 2020-10-20 ENCOUNTER — Encounter (HOSPITAL_COMMUNITY): Payer: Self-pay | Admitting: Internal Medicine

## 2020-10-23 MED FILL — Lidocaine HCl Local Inj 1%: INTRAMUSCULAR | Qty: 50 | Status: AC

## 2020-10-29 ENCOUNTER — Ambulatory Visit (INDEPENDENT_AMBULATORY_CARE_PROVIDER_SITE_OTHER): Payer: Medicare HMO | Admitting: Emergency Medicine

## 2020-10-29 ENCOUNTER — Other Ambulatory Visit: Payer: Self-pay

## 2020-10-29 DIAGNOSIS — Z950 Presence of cardiac pacemaker: Secondary | ICD-10-CM

## 2020-10-29 DIAGNOSIS — I442 Atrioventricular block, complete: Secondary | ICD-10-CM | POA: Diagnosis not present

## 2020-10-29 LAB — CUP PACEART INCLINIC DEVICE CHECK
Battery Remaining Longevity: 116 mo
Battery Voltage: 3.21 V
Brady Statistic AP VP Percent: 41.42 %
Brady Statistic AP VS Percent: 0.02 %
Brady Statistic AS VP Percent: 58.35 %
Brady Statistic AS VS Percent: 0.22 %
Brady Statistic RA Percent Paced: 41.44 %
Brady Statistic RV Percent Paced: 99.77 %
Date Time Interrogation Session: 20220512113200
Implantable Lead Implant Date: 20130107
Implantable Lead Implant Date: 20130107
Implantable Lead Location: 753859
Implantable Lead Location: 753860
Implantable Lead Model: 5076
Implantable Lead Model: 5092
Implantable Pulse Generator Implant Date: 20220502
Lead Channel Impedance Value: 399 Ohm
Lead Channel Impedance Value: 418 Ohm
Lead Channel Impedance Value: 456 Ohm
Lead Channel Impedance Value: 475 Ohm
Lead Channel Pacing Threshold Amplitude: 1.25 V
Lead Channel Pacing Threshold Amplitude: 1.25 V
Lead Channel Pacing Threshold Pulse Width: 0.4 ms
Lead Channel Pacing Threshold Pulse Width: 0.4 ms
Lead Channel Sensing Intrinsic Amplitude: 2.625 mV
Lead Channel Sensing Intrinsic Amplitude: 3 mV
Lead Channel Setting Pacing Amplitude: 2.75 V
Lead Channel Setting Pacing Amplitude: 3 V
Lead Channel Setting Pacing Pulse Width: 0.4 ms
Lead Channel Setting Sensing Sensitivity: 0.9 mV

## 2020-10-29 NOTE — Progress Notes (Signed)
Wound check appointment. Steri-strips removed. Wound without redness or edema. Incision edges approximated, wound well healed. Normal device function. Thresholds, sensing, and impedances consistent with implant measurements. Device programmed at chronic settings due to mature leads. Histogram distribution appropriate for patient and level of activity. No mode switches or high ventricular rates noted. Patient educated about wound care, arm mobility, lifting restrictions. ROV with Dr Lovena Le 01/27/21. Patient home monitor will be ordered from Medtronic, next remote scheduled is 01/21/21

## 2020-10-29 NOTE — Patient Instructions (Signed)
Call the office when you get your new monitor.  [facility]: [phone]

## 2020-11-11 DIAGNOSIS — I5032 Chronic diastolic (congestive) heart failure: Secondary | ICD-10-CM | POA: Diagnosis not present

## 2020-11-11 DIAGNOSIS — I10 Essential (primary) hypertension: Secondary | ICD-10-CM | POA: Diagnosis not present

## 2020-11-11 DIAGNOSIS — E039 Hypothyroidism, unspecified: Secondary | ICD-10-CM | POA: Diagnosis not present

## 2020-11-11 DIAGNOSIS — E1121 Type 2 diabetes mellitus with diabetic nephropathy: Secondary | ICD-10-CM | POA: Diagnosis not present

## 2020-11-11 DIAGNOSIS — M8580 Other specified disorders of bone density and structure, unspecified site: Secondary | ICD-10-CM | POA: Diagnosis not present

## 2020-11-11 DIAGNOSIS — E782 Mixed hyperlipidemia: Secondary | ICD-10-CM | POA: Diagnosis not present

## 2020-11-11 DIAGNOSIS — D62 Acute posthemorrhagic anemia: Secondary | ICD-10-CM | POA: Diagnosis not present

## 2020-11-11 DIAGNOSIS — E785 Hyperlipidemia, unspecified: Secondary | ICD-10-CM | POA: Diagnosis not present

## 2020-11-11 DIAGNOSIS — E119 Type 2 diabetes mellitus without complications: Secondary | ICD-10-CM | POA: Diagnosis not present

## 2020-12-28 DIAGNOSIS — N183 Chronic kidney disease, stage 3 unspecified: Secondary | ICD-10-CM | POA: Diagnosis not present

## 2020-12-28 DIAGNOSIS — E1121 Type 2 diabetes mellitus with diabetic nephropathy: Secondary | ICD-10-CM | POA: Diagnosis not present

## 2020-12-28 DIAGNOSIS — I429 Cardiomyopathy, unspecified: Secondary | ICD-10-CM | POA: Diagnosis not present

## 2020-12-28 DIAGNOSIS — I5032 Chronic diastolic (congestive) heart failure: Secondary | ICD-10-CM | POA: Diagnosis not present

## 2020-12-28 DIAGNOSIS — E039 Hypothyroidism, unspecified: Secondary | ICD-10-CM | POA: Diagnosis not present

## 2020-12-28 DIAGNOSIS — M5416 Radiculopathy, lumbar region: Secondary | ICD-10-CM | POA: Diagnosis not present

## 2020-12-28 DIAGNOSIS — M25561 Pain in right knee: Secondary | ICD-10-CM | POA: Diagnosis not present

## 2020-12-28 DIAGNOSIS — Z8719 Personal history of other diseases of the digestive system: Secondary | ICD-10-CM | POA: Diagnosis not present

## 2020-12-28 DIAGNOSIS — I10 Essential (primary) hypertension: Secondary | ICD-10-CM | POA: Diagnosis not present

## 2020-12-28 DIAGNOSIS — Z0000 Encounter for general adult medical examination without abnormal findings: Secondary | ICD-10-CM | POA: Diagnosis not present

## 2020-12-28 DIAGNOSIS — E782 Mixed hyperlipidemia: Secondary | ICD-10-CM | POA: Diagnosis not present

## 2020-12-28 DIAGNOSIS — M8580 Other specified disorders of bone density and structure, unspecified site: Secondary | ICD-10-CM | POA: Diagnosis not present

## 2020-12-28 DIAGNOSIS — I442 Atrioventricular block, complete: Secondary | ICD-10-CM | POA: Diagnosis not present

## 2020-12-28 DIAGNOSIS — D62 Acute posthemorrhagic anemia: Secondary | ICD-10-CM | POA: Diagnosis not present

## 2020-12-28 DIAGNOSIS — F419 Anxiety disorder, unspecified: Secondary | ICD-10-CM | POA: Diagnosis not present

## 2021-01-05 DIAGNOSIS — M25561 Pain in right knee: Secondary | ICD-10-CM | POA: Diagnosis not present

## 2021-01-11 ENCOUNTER — Telehealth: Payer: Self-pay

## 2021-01-11 NOTE — Telephone Encounter (Signed)
I let the patient husband know that the monitor is on backorder. I gave him the option of me giving his wife a cell phone with the app to follow her pacemaker. He states he would prefer her to have a monitor. I told him the monitors is on backorder and will take months before she will receive one. I told him I do call everyday to see when monitors will be available.

## 2021-01-15 DIAGNOSIS — D62 Acute posthemorrhagic anemia: Secondary | ICD-10-CM | POA: Diagnosis not present

## 2021-01-15 DIAGNOSIS — N183 Chronic kidney disease, stage 3 unspecified: Secondary | ICD-10-CM | POA: Diagnosis not present

## 2021-01-15 DIAGNOSIS — I5032 Chronic diastolic (congestive) heart failure: Secondary | ICD-10-CM | POA: Diagnosis not present

## 2021-01-15 DIAGNOSIS — E785 Hyperlipidemia, unspecified: Secondary | ICD-10-CM | POA: Diagnosis not present

## 2021-01-15 DIAGNOSIS — E1121 Type 2 diabetes mellitus with diabetic nephropathy: Secondary | ICD-10-CM | POA: Diagnosis not present

## 2021-01-15 DIAGNOSIS — I10 Essential (primary) hypertension: Secondary | ICD-10-CM | POA: Diagnosis not present

## 2021-01-15 DIAGNOSIS — E039 Hypothyroidism, unspecified: Secondary | ICD-10-CM | POA: Diagnosis not present

## 2021-01-15 DIAGNOSIS — E119 Type 2 diabetes mellitus without complications: Secondary | ICD-10-CM | POA: Diagnosis not present

## 2021-01-15 DIAGNOSIS — E782 Mixed hyperlipidemia: Secondary | ICD-10-CM | POA: Diagnosis not present

## 2021-01-27 ENCOUNTER — Encounter: Payer: Self-pay | Admitting: Internal Medicine

## 2021-01-27 ENCOUNTER — Other Ambulatory Visit: Payer: Self-pay

## 2021-01-27 ENCOUNTER — Ambulatory Visit: Payer: Medicare HMO | Admitting: Internal Medicine

## 2021-01-27 VITALS — BP 114/54 | HR 75 | Ht 60.0 in | Wt 152.0 lb

## 2021-01-27 DIAGNOSIS — Z950 Presence of cardiac pacemaker: Secondary | ICD-10-CM | POA: Diagnosis not present

## 2021-01-27 DIAGNOSIS — I10 Essential (primary) hypertension: Secondary | ICD-10-CM | POA: Diagnosis not present

## 2021-01-27 DIAGNOSIS — I442 Atrioventricular block, complete: Secondary | ICD-10-CM | POA: Diagnosis not present

## 2021-01-27 DIAGNOSIS — I5032 Chronic diastolic (congestive) heart failure: Secondary | ICD-10-CM | POA: Diagnosis not present

## 2021-01-27 MED ORDER — NITROGLYCERIN 0.4 MG SL SUBL
0.4000 mg | SUBLINGUAL_TABLET | SUBLINGUAL | 0 refills | Status: DC | PRN
Start: 2021-01-27 — End: 2022-05-23

## 2021-01-27 NOTE — Patient Instructions (Addendum)
Medication Instructions:  Your physician recommends that you continue on your current medications as directed. Please refer to the Current Medication list given to you today.  Labwork: None ordered.  Testing/Procedures: None ordered.  Follow-Up: Your physician wants you to follow-up in: one year with Cristopher Peru, MD or one of the following Advanced Practice Providers on your designated Care Team:   Tommye Standard, [place] Legrand Como "Jonni Sanger" Chalmers Cater, [place]  Remote monitoring is used to monitor your Pacemaker from home. This monitoring reduces the number of office visits required to check your device to one time per year. It allows [place] to keep an eye on the functioning of your device to ensure it is working properly. You are scheduled for a device check from home on 04/22/2021. You may send your transmission at any time that day. If you have a wireless device, the transmission will be sent automatically. After your physician reviews your transmission, you will receive a postcard with your next transmission date.  Any Other Special Instructions Will Be Listed Below (If Applicable).  If you need a refill on your cardiac medications before your next appointment, please call your pharmacy.

## 2021-01-27 NOTE — Progress Notes (Signed)
HPI [name] returns today for followup. She is a pleasant 80 yo woman with CHB, s/p PPM insertion. She underwent PM gen change out a couple of months ago. She has felt well and denies chest pain or sob. Minimal edema.  Allergies  Allergen Reactions   Crestor [Rosuvastatin Calcium] Shortness Of Breath    CHEST TIGHTNESS   Nsaids     History of bleeding ulcers   Lipitor [Atorvastatin]     MUSCLE ACHES   Penicillins Other (See Comments)    Dark stool     Current Outpatient Medications  Medication Sig Dispense Refill   acetaminophen (TYLENOL) 500 MG tablet Take 500 mg by mouth every 6 (six) hours as needed for moderate pain or headache.     atorvastatin (LIPITOR) 40 MG tablet Take 40 mg by mouth daily.     Azelastine HCl 137 MCG/SPRAY SOLN Place 1 spray into both nostrils 2 (two) times daily.     carvedilol (COREG) 6.25 MG tablet Take 6.25 mg by mouth 2 (two) times daily with a meal.     Cranberry 500 MG CAPS Take 500 mg by mouth daily.     furosemide (LASIX) 40 MG tablet Take 1 tablet (40 mg total) by mouth daily as needed. (Patient taking differently: Take 40 mg by mouth daily as needed for edema.) 90 tablet 3   levocetirizine (XYZAL) 5 MG tablet Take 5 mg by mouth every evening.     levothyroxine (SYNTHROID, LEVOTHROID) 75 MCG tablet Take 75 mcg by mouth daily.     losartan (COZAAR) 100 MG tablet Take 100 mg by mouth daily.     Omega-3 Fatty Acids (FISH OIL PO) Take 1,360 mg by mouth 2 (two) times daily.     omeprazole (PRILOSEC OTC) 20 MG tablet Take 20 mg by mouth daily as needed (indigestion).     potassium chloride SA (KLOR-CON) 20 MEQ tablet Take 1 tablet (20 mEq total) by mouth daily as needed. (Patient taking differently: Take 20 mEq by mouth daily as needed (when taking lasix).) 90 tablet 3   sertraline (ZOLOFT) 50 MG tablet Take 75 mg by mouth daily.     Tetrahydrozoline HCl (VISINE OP) Place 1 drop into both eyes daily as needed (redness).     nitroGLYCERIN  (NITROSTAT) 0.4 MG SL tablet Place 1 tablet (0.4 mg total) under the tongue every 5 (five) minutes as needed for chest pain. 90 tablet 0   No current facility-administered medications for this visit.     Past Medical History:  Diagnosis Date   Anemia 2016   CAD (coronary artery disease)    a. LHC (2/28): Severe HK at the apex, EF 40%, diffuse luminal irregularities, no significant CAD.;  b.  Lexiscan Myoview (2/13): EF 76%, normal perfusion; low-risk study;  c.  Lexiscan Myoview (10/14):  Low risk, no ischemia, EF 75%.   Chronic systolic CHF (congestive heart failure) (HCC)    EF previously 40%; NICM; EF recovered;  Echo (03/2010): Normal LV function, EF 68.4%, apical AK, mild MR, mild TR, normal RV systolic pressure, diastolic dysfunction.   CKD (chronic kidney disease), stage III (HCC)    Complete heart block (HCC)    Diabetes mellitus without complication (HCC)    Ejection fraction < 50%    68%   Fatty liver    Gastritis    Hypertension    Hypothyroidism    Mild mitral regurgitation    NICM (nonischemic cardiomyopathy) ([place])    non-ischemic;  resolved EF 75%; GSPECT   Pacemaker 2005   Medtronic [id number] for third-degree AV block   Pre-diabetes    Radiculopathy    followed by Dr. Krista Phillips   Syncope     ROS:   All systems reviewed and negative except as noted in the HPI.   Past Surgical History:  Procedure Laterality Date   BREAST ENHANCEMENT SURGERY     CATARACT EXTRACTION     DOPPLER ECHOCARDIOGRAPHY  03/2010   with normal left ventricular function, ejection fraction 68%, apical akinesis and mild mitral regurgitation   ESOPHAGOGASTRODUODENOSCOPY     INTRAOCULAR LENS INSERTION     PACEMAKER GENERATOR CHANGE N/A 06/27/2011   Procedure: PACEMAKER GENERATOR CHANGE;  Surgeon: Gabriela Lance, MD;  Location: [facility] CATH LAB;  Service: Cardiovascular;  Laterality: N/A;   PACEMAKER INSERTION  2005   complete heart block   PPM GENERATOR CHANGEOUT N/A 10/19/2020   Procedure: PPM GENERATOR  CHANGEOUT;  Surgeon: Gabriela Lance, MD;  Location: [facility] CV LAB;  Service: Cardiovascular;  Laterality: N/A;   ROOT CANAL     TOTAL ABDOMINAL HYSTERECTOMY W/ BILATERAL SALPINGOOPHORECTOMY  1989   X2   UPPER GI ENDOSCOPY       Family History  Problem Relation Age of Onset   Diabetes Mother    Heart Problems Mother    Anemia Mother    Liver cancer Father    Diabetes Father    Alcoholism Father    Diabetes Sister    Heart attack Neg Hx    Stroke Neg Hx    Hypertension Neg Hx      Social History   Socioeconomic History   Marital status: Married    Spouse name: Not on file   Number of children: Not on file   Years of education: Not on file   Highest education level: Not on file  Occupational History   Not on file  Tobacco Use   Smoking status: Former    Types: Cigarettes    Quit date: 06/23/1997    Years since quitting: 23.6   Smokeless tobacco: Never  Substance and Sexual Activity   Alcohol use: No   Drug use: No   Sexual activity: Not on file  Other Topics Concern   Not on file  Social History Narrative   Not on file   Social Determinants of Health   Financial Resource Strain: Not on file  Food Insecurity: Not on file  Transportation Needs: Not on file  Physical Activity: Not on file  Stress: Not on file  Social Connections: Not on file  Intimate Partner Violence: Not on file     BP (!) 114/54   Pulse 75   Ht 5' (1.524 m)   Wt 152 lb (68.9 kg)   BMI 29.69 kg/m   Physical Exam:  Well appearing NAD HEENT: Unremarkable Neck:  No JVD, no thyromegally Lymphatics:  No adenopathy Back:  No CVA tenderness Lungs:  Clear with no wheezes HEART:  Regular rate rhythm, no murmurs, no rubs, no clicks Abd:  soft, positive bowel sounds, no organomegally, no rebound, no guarding Ext:  2 plus pulses, no edema, no cyanosis, no clubbing Skin:  No rashes no nodules Neuro:  CN II through XII intact, motor grossly intact  EKG - NSR with ventricular  pacing  DEVICE  Normal device function.  See PaceArt for details.   Assess/Plan:  CHB - she is s/p PPM insertion and doing well.  HTN - her bp is well  controlled.  CAD - she denies anginal symptoms. She is encouraged to increase her physical activity PPM -her medtronic DDD PM is working normally.  Carleene Overlie Gabriela Bertucci,MD

## 2021-02-02 DIAGNOSIS — M1711 Unilateral primary osteoarthritis, right knee: Secondary | ICD-10-CM | POA: Diagnosis not present

## 2021-02-15 ENCOUNTER — Ambulatory Visit (INDEPENDENT_AMBULATORY_CARE_PROVIDER_SITE_OTHER): Payer: Medicare HMO

## 2021-02-15 DIAGNOSIS — I442 Atrioventricular block, complete: Secondary | ICD-10-CM | POA: Diagnosis not present

## 2021-02-16 LAB — CUP PACEART REMOTE DEVICE CHECK
Battery Remaining Longevity: 107 mo
Battery Voltage: 3.16 V
Brady Statistic AP VP Percent: 31.47 %
Brady Statistic AP VS Percent: 0.01 %
Brady Statistic AS VP Percent: 68.31 %
Brady Statistic AS VS Percent: 0.21 %
Brady Statistic RA Percent Paced: 31.59 %
Brady Statistic RV Percent Paced: 99.78 %
Date Time Interrogation Session: 20220829114526
Implantable Lead Implant Date: 20130107
Implantable Lead Implant Date: 20130107
Implantable Lead Location: 753859
Implantable Lead Location: 753860
Implantable Lead Model: 5076
Implantable Lead Model: 5092
Implantable Pulse Generator Implant Date: 20220502
Lead Channel Impedance Value: 361 Ohm
Lead Channel Impedance Value: 361 Ohm
Lead Channel Impedance Value: 399 Ohm
Lead Channel Impedance Value: 399 Ohm
Lead Channel Pacing Threshold Amplitude: 1.375 V
Lead Channel Pacing Threshold Amplitude: 1.5 V
Lead Channel Pacing Threshold Pulse Width: 0.4 ms
Lead Channel Pacing Threshold Pulse Width: 0.4 ms
Lead Channel Sensing Intrinsic Amplitude: 2.375 mV
Lead Channel Sensing Intrinsic Amplitude: 2.375 mV
Lead Channel Sensing Intrinsic Amplitude: 3.625 mV
Lead Channel Setting Pacing Amplitude: 2.75 V
Lead Channel Setting Pacing Amplitude: 3 V
Lead Channel Setting Pacing Pulse Width: 0.4 ms
Lead Channel Setting Sensing Sensitivity: 0.9 mV

## 2021-02-26 NOTE — Progress Notes (Signed)
Remote pacemaker transmission.   

## 2021-03-01 DIAGNOSIS — M1711 Unilateral primary osteoarthritis, right knee: Secondary | ICD-10-CM | POA: Diagnosis not present

## 2021-03-15 DIAGNOSIS — E039 Hypothyroidism, unspecified: Secondary | ICD-10-CM | POA: Diagnosis not present

## 2021-03-15 DIAGNOSIS — I5032 Chronic diastolic (congestive) heart failure: Secondary | ICD-10-CM | POA: Diagnosis not present

## 2021-03-15 DIAGNOSIS — N183 Chronic kidney disease, stage 3 unspecified: Secondary | ICD-10-CM | POA: Diagnosis not present

## 2021-03-15 DIAGNOSIS — E1121 Type 2 diabetes mellitus with diabetic nephropathy: Secondary | ICD-10-CM | POA: Diagnosis not present

## 2021-03-15 DIAGNOSIS — M8580 Other specified disorders of bone density and structure, unspecified site: Secondary | ICD-10-CM | POA: Diagnosis not present

## 2021-03-15 DIAGNOSIS — I10 Essential (primary) hypertension: Secondary | ICD-10-CM | POA: Diagnosis not present

## 2021-03-15 DIAGNOSIS — E782 Mixed hyperlipidemia: Secondary | ICD-10-CM | POA: Diagnosis not present

## 2021-03-15 DIAGNOSIS — E119 Type 2 diabetes mellitus without complications: Secondary | ICD-10-CM | POA: Diagnosis not present

## 2021-04-09 DIAGNOSIS — Z1231 Encounter for screening mammogram for malignant neoplasm of breast: Secondary | ICD-10-CM | POA: Diagnosis not present

## 2021-04-09 DIAGNOSIS — M85851 Other specified disorders of bone density and structure, right thigh: Secondary | ICD-10-CM | POA: Diagnosis not present

## 2021-04-09 DIAGNOSIS — M85852 Other specified disorders of bone density and structure, left thigh: Secondary | ICD-10-CM | POA: Diagnosis not present

## 2021-04-09 DIAGNOSIS — Z780 Asymptomatic menopausal state: Secondary | ICD-10-CM | POA: Diagnosis not present

## 2021-05-17 ENCOUNTER — Ambulatory Visit (INDEPENDENT_AMBULATORY_CARE_PROVIDER_SITE_OTHER): Payer: Medicare HMO

## 2021-05-17 DIAGNOSIS — I442 Atrioventricular block, complete: Secondary | ICD-10-CM

## 2021-05-17 LAB — CUP PACEART REMOTE DEVICE CHECK
Battery Remaining Longevity: 114 mo
Battery Voltage: 3.09 V
Brady Statistic AP VP Percent: 36.06 %
Brady Statistic AP VS Percent: 0.03 %
Brady Statistic AS VP Percent: 63.62 %
Brady Statistic AS VS Percent: 0.29 %
Brady Statistic RA Percent Paced: 36.23 %
Brady Statistic RV Percent Paced: 99.68 %
Date Time Interrogation Session: 20221127201104
Implantable Lead Implant Date: 20130107
Implantable Lead Implant Date: 20130107
Implantable Lead Location: 753859
Implantable Lead Location: 753860
Implantable Lead Model: 5076
Implantable Lead Model: 5092
Implantable Pulse Generator Implant Date: 20220502
Lead Channel Impedance Value: 380 Ohm
Lead Channel Impedance Value: 399 Ohm
Lead Channel Impedance Value: 418 Ohm
Lead Channel Impedance Value: 437 Ohm
Lead Channel Pacing Threshold Amplitude: 1.25 V
Lead Channel Pacing Threshold Amplitude: 1.5 V
Lead Channel Pacing Threshold Pulse Width: 0.4 ms
Lead Channel Pacing Threshold Pulse Width: 0.4 ms
Lead Channel Sensing Intrinsic Amplitude: 2.375 mV
Lead Channel Sensing Intrinsic Amplitude: 2.375 mV
Lead Channel Sensing Intrinsic Amplitude: 3.625 mV
Lead Channel Setting Pacing Amplitude: 2.5 V
Lead Channel Setting Pacing Amplitude: 3 V
Lead Channel Setting Pacing Pulse Width: 0.4 ms
Lead Channel Setting Sensing Sensitivity: 0.9 mV

## 2021-05-26 NOTE — Progress Notes (Signed)
Remote pacemaker transmission.   

## 2021-05-31 DIAGNOSIS — Z6830 Body mass index (BMI) 30.0-30.9, adult: Secondary | ICD-10-CM | POA: Diagnosis not present

## 2021-05-31 DIAGNOSIS — U071 COVID-19: Secondary | ICD-10-CM | POA: Diagnosis not present

## 2021-05-31 DIAGNOSIS — R059 Cough, unspecified: Secondary | ICD-10-CM | POA: Diagnosis not present

## 2021-05-31 DIAGNOSIS — R0981 Nasal congestion: Secondary | ICD-10-CM | POA: Diagnosis not present

## 2021-06-08 DIAGNOSIS — I5032 Chronic diastolic (congestive) heart failure: Secondary | ICD-10-CM | POA: Diagnosis not present

## 2021-06-08 DIAGNOSIS — E119 Type 2 diabetes mellitus without complications: Secondary | ICD-10-CM | POA: Diagnosis not present

## 2021-06-08 DIAGNOSIS — E1121 Type 2 diabetes mellitus with diabetic nephropathy: Secondary | ICD-10-CM | POA: Diagnosis not present

## 2021-06-08 DIAGNOSIS — N183 Chronic kidney disease, stage 3 unspecified: Secondary | ICD-10-CM | POA: Diagnosis not present

## 2021-06-08 DIAGNOSIS — E782 Mixed hyperlipidemia: Secondary | ICD-10-CM | POA: Diagnosis not present

## 2021-06-08 DIAGNOSIS — M8580 Other specified disorders of bone density and structure, unspecified site: Secondary | ICD-10-CM | POA: Diagnosis not present

## 2021-06-08 DIAGNOSIS — E039 Hypothyroidism, unspecified: Secondary | ICD-10-CM | POA: Diagnosis not present

## 2021-06-08 DIAGNOSIS — I10 Essential (primary) hypertension: Secondary | ICD-10-CM | POA: Diagnosis not present

## 2021-06-08 DIAGNOSIS — D62 Acute posthemorrhagic anemia: Secondary | ICD-10-CM | POA: Diagnosis not present

## 2021-06-28 DIAGNOSIS — Z1389 Encounter for screening for other disorder: Secondary | ICD-10-CM | POA: Diagnosis not present

## 2021-06-28 DIAGNOSIS — E782 Mixed hyperlipidemia: Secondary | ICD-10-CM | POA: Diagnosis not present

## 2021-06-28 DIAGNOSIS — Z0000 Encounter for general adult medical examination without abnormal findings: Secondary | ICD-10-CM | POA: Diagnosis not present

## 2021-06-28 DIAGNOSIS — E039 Hypothyroidism, unspecified: Secondary | ICD-10-CM | POA: Diagnosis not present

## 2021-06-28 DIAGNOSIS — D62 Acute posthemorrhagic anemia: Secondary | ICD-10-CM | POA: Diagnosis not present

## 2021-06-28 DIAGNOSIS — E1121 Type 2 diabetes mellitus with diabetic nephropathy: Secondary | ICD-10-CM | POA: Diagnosis not present

## 2021-06-30 DIAGNOSIS — D62 Acute posthemorrhagic anemia: Secondary | ICD-10-CM | POA: Diagnosis not present

## 2021-06-30 DIAGNOSIS — E781 Pure hyperglyceridemia: Secondary | ICD-10-CM | POA: Diagnosis not present

## 2021-06-30 DIAGNOSIS — R0981 Nasal congestion: Secondary | ICD-10-CM | POA: Diagnosis not present

## 2021-06-30 DIAGNOSIS — E1121 Type 2 diabetes mellitus with diabetic nephropathy: Secondary | ICD-10-CM | POA: Diagnosis not present

## 2021-06-30 DIAGNOSIS — E782 Mixed hyperlipidemia: Secondary | ICD-10-CM | POA: Diagnosis not present

## 2021-06-30 DIAGNOSIS — I10 Essential (primary) hypertension: Secondary | ICD-10-CM | POA: Diagnosis not present

## 2021-06-30 DIAGNOSIS — E039 Hypothyroidism, unspecified: Secondary | ICD-10-CM | POA: Diagnosis not present

## 2021-08-16 ENCOUNTER — Ambulatory Visit (INDEPENDENT_AMBULATORY_CARE_PROVIDER_SITE_OTHER): Payer: Medicare HMO

## 2021-08-16 DIAGNOSIS — I442 Atrioventricular block, complete: Secondary | ICD-10-CM | POA: Diagnosis not present

## 2021-08-16 LAB — CUP PACEART REMOTE DEVICE CHECK
Battery Remaining Longevity: 103 mo
Battery Voltage: 3.03 V
Brady Statistic AP VP Percent: 41.34 %
Brady Statistic AP VS Percent: 0.02 %
Brady Statistic AS VP Percent: 58.48 %
Brady Statistic AS VS Percent: 0.16 %
Brady Statistic RA Percent Paced: 41.39 %
Brady Statistic RV Percent Paced: 99.82 %
Date Time Interrogation Session: 20230226205519
Implantable Lead Implant Date: 20130107
Implantable Lead Implant Date: 20130107
Implantable Lead Location: 753859
Implantable Lead Location: 753860
Implantable Lead Model: 5076
Implantable Lead Model: 5092
Implantable Pulse Generator Implant Date: 20220502
Lead Channel Impedance Value: 380 Ohm
Lead Channel Impedance Value: 380 Ohm
Lead Channel Impedance Value: 399 Ohm
Lead Channel Impedance Value: 418 Ohm
Lead Channel Pacing Threshold Amplitude: 1.375 V
Lead Channel Pacing Threshold Amplitude: 1.375 V
Lead Channel Pacing Threshold Pulse Width: 0.4 ms
Lead Channel Pacing Threshold Pulse Width: 0.4 ms
Lead Channel Sensing Intrinsic Amplitude: 2.25 mV
Lead Channel Sensing Intrinsic Amplitude: 2.25 mV
Lead Channel Sensing Intrinsic Amplitude: 3.625 mV
Lead Channel Setting Pacing Amplitude: 2.75 V
Lead Channel Setting Pacing Amplitude: 2.75 V
Lead Channel Setting Pacing Pulse Width: 0.4 ms
Lead Channel Setting Sensing Sensitivity: 0.9 mV

## 2021-08-20 NOTE — Progress Notes (Signed)
Remote pacemaker transmission.   

## 2021-09-06 DIAGNOSIS — E782 Mixed hyperlipidemia: Secondary | ICD-10-CM | POA: Diagnosis not present

## 2021-09-06 DIAGNOSIS — E1121 Type 2 diabetes mellitus with diabetic nephropathy: Secondary | ICD-10-CM | POA: Diagnosis not present

## 2021-09-06 DIAGNOSIS — I10 Essential (primary) hypertension: Secondary | ICD-10-CM | POA: Diagnosis not present

## 2021-09-13 DIAGNOSIS — R059 Cough, unspecified: Secondary | ICD-10-CM | POA: Diagnosis not present

## 2021-09-13 DIAGNOSIS — R0981 Nasal congestion: Secondary | ICD-10-CM | POA: Diagnosis not present

## 2021-09-13 DIAGNOSIS — R0982 Postnasal drip: Secondary | ICD-10-CM | POA: Diagnosis not present

## 2021-09-13 DIAGNOSIS — J019 Acute sinusitis, unspecified: Secondary | ICD-10-CM | POA: Diagnosis not present

## 2021-10-01 DIAGNOSIS — E119 Type 2 diabetes mellitus without complications: Secondary | ICD-10-CM | POA: Diagnosis not present

## 2021-10-01 DIAGNOSIS — Z961 Presence of intraocular lens: Secondary | ICD-10-CM | POA: Diagnosis not present

## 2021-10-01 DIAGNOSIS — H52203 Unspecified astigmatism, bilateral: Secondary | ICD-10-CM | POA: Diagnosis not present

## 2021-10-11 ENCOUNTER — Other Ambulatory Visit: Payer: Self-pay | Admitting: Interventional Cardiology

## 2021-11-04 NOTE — Progress Notes (Signed)
Cardiology Office Note   Date:  11/05/2021   ID:  Gabriela Phillips, DOB April 05, 1941, MRN [id number]  PCP:  Gabriela Nip, MD    Chief Complaint  Patient presents with   Follow-up   Chronic diastolic heart failure  Wt Readings from Last 3 Encounters:  11/05/21 150 lb (68 kg)  01/27/21 152 lb (68.9 kg)  10/19/20 148 lb (67.1 kg)       History of Present Illness: Gabriela Phillips is a 81 y.o. female  who has had a pacer placed. SHe has nonobstructive CAD by cath in 2008.  SHe had some chest pain and then a negative stress test in 10/14.     In the past, it was noted: "Overall, she feels well.  She remains active.  She tries to eat healthy and watch portion sizes."    She has been vaccinated for COVID.   In 2021, She decreased the frequency of Lasix due to lack of swelling since weight loss.    She had battery change June 2022.  She has had some DOE.   Worse in hot weather.  OK in room temperature.  SL NTG. Knee pain limits walking.  Only walking is for shopping.  No regular exercise.  Got some gel shots.  Diagnosed with arthritis.      Past Medical History:  Diagnosis Date   Anemia 2016   CAD (coronary artery disease)    a. LHC (2/28): Severe HK at the apex, EF 40%, diffuse luminal irregularities, no significant CAD.;  b.  Lexiscan Myoview (2/13): EF 76%, normal perfusion; low-risk study;  c.  Lexiscan Myoview (10/14):  Low risk, no ischemia, EF 75%.   Chronic systolic CHF (congestive heart failure) (HCC)    EF previously 40%; NICM; EF recovered;  Echo (03/2010): Normal LV function, EF 68.4%, apical AK, mild MR, mild TR, normal RV systolic pressure, diastolic dysfunction.   CKD (chronic kidney disease), stage III (HCC)    Complete heart block (HCC)    Diabetes mellitus without complication (HCC)    Ejection fraction < 50%    68%   Fatty liver    Gastritis    Hypertension    Hypothyroidism    Mild mitral regurgitation    NICM (nonischemic cardiomyopathy)  ([place])    non-ischemic; resolved EF 75%; GSPECT   Pacemaker 2005   Medtronic [id number] for third-degree AV block   Pre-diabetes    Radiculopathy    followed by Dr. Krista Blue   Syncope     Past Surgical History:  Procedure Laterality Date   BREAST ENHANCEMENT SURGERY     CATARACT EXTRACTION     DOPPLER ECHOCARDIOGRAPHY  03/2010   with normal left ventricular function, ejection fraction 68%, apical akinesis and mild mitral regurgitation   ESOPHAGOGASTRODUODENOSCOPY     INTRAOCULAR LENS INSERTION     PACEMAKER GENERATOR CHANGE N/A 06/27/2011   Procedure: PACEMAKER GENERATOR CHANGE;  Surgeon: Evans Lance, MD;  Location: [facility] CATH LAB;  Service: Cardiovascular;  Laterality: N/A;   PACEMAKER INSERTION  2005   complete heart block   PPM GENERATOR CHANGEOUT N/A 10/19/2020   Procedure: PPM GENERATOR CHANGEOUT;  Surgeon: Evans Lance, MD;  Location: [facility] CV LAB;  Service: Cardiovascular;  Laterality: N/A;   ROOT CANAL     TOTAL ABDOMINAL HYSTERECTOMY W/ BILATERAL SALPINGOOPHORECTOMY  1989   X2   UPPER GI ENDOSCOPY       Current Outpatient Medications  Medication Sig Dispense Refill  acetaminophen (TYLENOL) 500 MG tablet Take 500 mg by mouth every 6 (six) hours as needed for moderate pain or headache.     atorvastatin (LIPITOR) 40 MG tablet Take 40 mg by mouth daily.     Azelastine HCl 137 MCG/SPRAY SOLN Place 1 spray into both nostrils 2 (two) times daily.     carvedilol (COREG) 6.25 MG tablet Take 6.25 mg by mouth 2 (two) times daily with a meal.     Cranberry 500 MG CAPS Take 500 mg by mouth daily.     furosemide (LASIX) 40 MG tablet TAKE 1 TABLET DAILY AS NEEDED. 90 tablet 0   levocetirizine (XYZAL) 5 MG tablet Take 5 mg by mouth every evening.     levothyroxine (SYNTHROID, LEVOTHROID) 75 MCG tablet Take 75 mcg by mouth daily.     losartan (COZAAR) 100 MG tablet Take 100 mg by mouth daily.     nitroGLYCERIN (NITROSTAT) 0.4 MG SL tablet Place 1 tablet (0.4 mg total) under the tongue  every 5 (five) minutes as needed for chest pain. 90 tablet 0   Omega-3 Fatty Acids (FISH OIL PO) Take 1,360 mg by mouth 2 (two) times daily.     omeprazole (PRILOSEC OTC) 20 MG tablet Take 20 mg by mouth daily as needed (indigestion).     potassium chloride SA (KLOR-CON M) 20 MEQ tablet TAKE 1 TABLET DAILY AS NEEDED. 90 tablet 0   sertraline (ZOLOFT) 50 MG tablet Take 50 mg by mouth daily.     Tetrahydrozoline HCl (VISINE OP) Place 1 drop into both eyes daily as needed (redness).     No current facility-administered medications for this visit.    Allergies:   Crestor [rosuvastatin calcium], Rosuvastatin, Nsaids, Lipitor [atorvastatin], and Penicillins    Social History:  The patient  reports that she quit smoking about 24 years ago. Her smoking use included cigarettes. She has never used smokeless tobacco. She reports that she does not drink alcohol and does not use drugs.   Family History:  The patient's family history includes Alcoholism in her father; Anemia in her mother; Diabetes in her father, mother, and sister; Heart Problems in her mother; Liver cancer in her father.    ROS:  Please see the history of present illness.   Otherwise, review of systems are positive for knee pain.   All other systems are reviewed and negative.    PHYSICAL EXAM: VS:  BP 114/60   Pulse 68   Ht 5' (1.524 m)   Wt 150 lb (68 kg)   SpO2 99%   BMI 29.29 kg/m  , BMI Body mass index is 29.29 kg/m. GEN: Well nourished, well developed, in no acute distress HEENT: normal Neck: no JVD, carotid bruits, or masses Cardiac: RRR; no murmurs, rubs, or gallops,; minimal LE edema bilaterally Respiratory:  clear to auscultation bilaterally, normal work of breathing GI: soft, nontender, nondistended, + BS MS: no deformity or atrophy Skin: warm and dry, no rash Neuro:  Strength and sensation are intact Psych: euthymic mood, full affect   EKG:   The ekg ordered today demonstrates AV pacing   Recent Labs: No  results found for requested labs within last 8760 hours.   Lipid Panel No results found for: CHOL, TRIG, HDL, CHOLHDL, VLDL, LDLCALC, LDLDIRECT   Other studies Reviewed: Additional studies/ records that were reviewed today with results demonstrating: labs reviewed.   ASSESSMENT AND PLAN:  Chronic diastolic heart failure: Low-salt diet.  Appears euvolemic.  Elevate legs if she does  get some ankle edema. Chest discomfort: We will plan on doing CTA of the coronary arteries to evaluate for any blockage.  Renal function was normal in January.  heart rate is already slow and rhythm is paced. HTN: The current medical regimen is effective;  continue present plan and medications. Pacer: Followed by EP. Hyperlipidemia: Whole food, plant-based diet.  LDL 85, TG 318 in 1/23.  Triglycerides will improve with more activity. PreDM: High-fiber diet recommended.  A1C 6.6.   Current medicines are reviewed at length with the patient today.  The patient concerns regarding her medicines were addressed.  The following changes have been made:  No change  Labs/ tests ordered today include:  No orders of the defined types were placed in this encounter.   Recommend 150 minutes/week of aerobic exercise Low fat, low carb, high fiber diet recommended  Disposition:   FU in 1 year or sooner if problem noted on CTA   Signed, Larae Grooms, MD  11/05/2021 [address], [place], [place]  [address] Phone: [phone]; Fax: [phone]

## 2021-11-05 ENCOUNTER — Encounter: Payer: Self-pay | Admitting: Interventional Cardiology

## 2021-11-05 ENCOUNTER — Ambulatory Visit: Payer: Medicare HMO | Admitting: Interventional Cardiology

## 2021-11-05 VITALS — BP 114/60 | HR 68 | Ht 60.0 in | Wt 150.0 lb

## 2021-11-05 DIAGNOSIS — R072 Precordial pain: Secondary | ICD-10-CM

## 2021-11-05 DIAGNOSIS — I10 Essential (primary) hypertension: Secondary | ICD-10-CM | POA: Diagnosis not present

## 2021-11-05 DIAGNOSIS — I442 Atrioventricular block, complete: Secondary | ICD-10-CM | POA: Diagnosis not present

## 2021-11-05 DIAGNOSIS — E782 Mixed hyperlipidemia: Secondary | ICD-10-CM | POA: Diagnosis not present

## 2021-11-05 DIAGNOSIS — I5032 Chronic diastolic (congestive) heart failure: Secondary | ICD-10-CM

## 2021-11-05 DIAGNOSIS — Z950 Presence of cardiac pacemaker: Secondary | ICD-10-CM

## 2021-11-05 LAB — BASIC METABOLIC PANEL
BUN/Creatinine Ratio: 26 (ref 12–28)
BUN: 25 mg/dL (ref 8–27)
CO2: 24 mmol/L (ref 20–29)
Calcium: 10 mg/dL (ref 8.7–10.3)
Chloride: 105 mmol/L (ref 96–106)
Creatinine, Ser: 0.97 mg/dL (ref 0.57–1.00)
Glucose: 120 mg/dL — ABNORMAL HIGH (ref 70–99)
Potassium: 4.9 mmol/L (ref 3.5–5.2)
Sodium: 143 mmol/L (ref 134–144)
eGFR: 59 mL/min/{1.73_m2} — ABNORMAL LOW (ref >59)

## 2021-11-05 NOTE — Patient Instructions (Addendum)
Medication Instructions:  Your physician recommends that you continue on your current medications as directed. Please refer to the Current Medication list given to you today.  *If you need a refill on your cardiac medications before your next appointment, please call your pharmacy*   Lab Work: Lab work to be done today--BMP If you have labs (blood work) drawn today and your tests are completely normal, you will receive your results only by: [place] (if you have MyChart) OR A paper copy in the mail If you have any lab test that is abnormal or we need to change your treatment, we will call you to review the results.   Testing/Procedures: Your physician has requested that you have cardiac CT. Cardiac computed tomography (CT) is a painless test that uses an x-ray machine to take clear, detailed pictures of your heart. For further information please visit [email or web address]. Please follow instruction sheet as given.     Follow-Up: At [facility], you and your health needs are our priority.  As part of our continuing mission to provide you with exceptional heart care, we have created designated Provider Care Teams.  These Care Teams include your primary Cardiologist (physician) and Advanced Practice Providers (APPs -  Physician Assistants and Nurse Practitioners) who all work together to provide you with the care you need, when you need it.  We recommend signing up for the patient portal called "MyChart".  Sign up information is provided on this After Visit Summary.  MyChart is used to connect with patients for Virtual Visits (Telemedicine).  Patients are able to view lab/test results, encounter notes, upcoming appointments, etc.  Non-urgent messages can be sent to your provider as well.   To learn more about what you can do with MyChart, go to [email or web address].    Your next appointment:   12 month(s)  The format for your next appointment:   In Person  Provider:    Larae Grooms, MD     Other Instructions   Please schedule follow up with Dr Lovena Le   Your cardiac CT will be scheduled at one of the below locations:   [facility] [address] [place], [place] [address] [phone]  [facility] [address] [place], [place] [address] [phone]  If scheduled at [facility], please arrive at the [facility] and Children's Entrance (Entrance C2) of [facility] 30 minutes prior to test start time. You can use the FREE valet parking offered at entrance C (encouraged to control the heart rate for the test)  Proceed to the [facility] Radiology Department (first floor) to check-in and test prep.  All radiology patients and guests should use entrance C2 at [facility], accessed from [facility], even though the hospital's physical address listed is [address].    If scheduled at [facility], please arrive 15 mins early for check-in and test prep.  Please follow these instructions carefully (unless otherwise directed):  Hold all erectile dysfunction medications at least 3 days (72 hrs) prior to test.  On the Night Before the Test: Be sure to Drink plenty of water. Do not consume any caffeinated/decaffeinated beverages or chocolate 12 hours prior to your test. Do not take any antihistamines 12 hours prior to your test.   On the Day of the Test: Drink plenty of water until 1 hour prior to the test. Do not eat any food 4 hours prior to the  test. You may take your regular medications prior to the test.  Take morning dose of Carvedilol two hours prior to test HOLD Furosemide/Hydrochlorothiazide morning of the test. FEMALES- please wear underwire-free bra if available, avoid dresses & tight clothing       After the Test: Drink plenty of water. After receiving IV contrast, you may experience a mild  flushed feeling. This is normal. On occasion, you may experience a mild rash up to 24 hours after the test. This is not dangerous. If this occurs, you can take Benadryl 25 mg and increase your fluid intake. If you experience trouble breathing, this can be serious. If it is severe call 911 IMMEDIATELY. If it is mild, please call our office. If you take any of these medications: Glipizide/Metformin, Avandament, Glucavance, please do not take 48 hours after completing test unless otherwise instructed.  We will call to schedule your test 2-4 weeks out understanding that some insurance companies will need an authorization prior to the service being performed.   For non-scheduling related questions, please contact the cardiac imaging nurse navigator should you have any questions/concerns: Marchia Bond, Cardiac Imaging Nurse Navigator Gordy Clement, Cardiac Imaging Nurse Navigator [facility] Direct Office Dial: [phone]   For scheduling needs, including cancellations and rescheduling, please call [place], [phone].   Important Information About [place]

## 2021-11-16 ENCOUNTER — Ambulatory Visit (INDEPENDENT_AMBULATORY_CARE_PROVIDER_SITE_OTHER): Payer: Medicare HMO

## 2021-11-16 DIAGNOSIS — I442 Atrioventricular block, complete: Secondary | ICD-10-CM | POA: Diagnosis not present

## 2021-11-17 LAB — CUP PACEART REMOTE DEVICE CHECK
Battery Remaining Longevity: 93 mo
Battery Voltage: 3.01 V
Brady Statistic AP VP Percent: 38.85 %
Brady Statistic AP VS Percent: 0.03 %
Brady Statistic AS VP Percent: 61 %
Brady Statistic AS VS Percent: 0.12 %
Brady Statistic RA Percent Paced: 38.85 %
Brady Statistic RV Percent Paced: 99.84 %
Date Time Interrogation Session: 20230530025735
Implantable Lead Implant Date: 20130107
Implantable Lead Implant Date: 20130107
Implantable Lead Location: 753859
Implantable Lead Location: 753860
Implantable Lead Model: 5076
Implantable Lead Model: 5092
Implantable Pulse Generator Implant Date: 20220502
Lead Channel Impedance Value: 323 Ohm
Lead Channel Impedance Value: 323 Ohm
Lead Channel Impedance Value: 361 Ohm
Lead Channel Impedance Value: 380 Ohm
Lead Channel Pacing Threshold Amplitude: 1.375 V
Lead Channel Pacing Threshold Amplitude: 1.375 V
Lead Channel Pacing Threshold Pulse Width: 0.4 ms
Lead Channel Pacing Threshold Pulse Width: 0.4 ms
Lead Channel Sensing Intrinsic Amplitude: 1.875 mV
Lead Channel Sensing Intrinsic Amplitude: 1.875 mV
Lead Channel Sensing Intrinsic Amplitude: 3.625 mV
Lead Channel Setting Pacing Amplitude: 2.75 V
Lead Channel Setting Pacing Amplitude: 3.25 V
Lead Channel Setting Pacing Pulse Width: 0.4 ms
Lead Channel Setting Sensing Sensitivity: 0.9 mV

## 2021-11-24 ENCOUNTER — Telehealth (HOSPITAL_COMMUNITY): Payer: Self-pay | Admitting: *Deleted

## 2021-11-24 NOTE — Telephone Encounter (Signed)
Attempted to call patient regarding upcoming cardiac CT appointment. °Left message on voicemail with name and callback number ° °Perris Conwell RN Navigator Cardiac Imaging °[facility] °[phone] Office °[phone] Cell ° °

## 2021-11-25 ENCOUNTER — Ambulatory Visit (HOSPITAL_COMMUNITY)
Admission: RE | Admit: 2021-11-25 | Discharge: 2021-11-25 | Disposition: A | Discharge status: Home / Self Care | Payer: Medicare HMO | Source: Ambulatory Visit | Attending: Interventional Cardiology | Admitting: Interventional Cardiology

## 2021-11-25 ENCOUNTER — Other Ambulatory Visit: Payer: Self-pay | Admitting: Cardiology

## 2021-11-25 DIAGNOSIS — R072 Precordial pain: Secondary | ICD-10-CM

## 2021-11-25 DIAGNOSIS — R931 Abnormal findings on diagnostic imaging of heart and coronary circulation: Secondary | ICD-10-CM

## 2021-11-25 MED ORDER — IOHEXOL 350 MG/ML SOLN
100.0000 mL | Freq: Once | INTRAVENOUS | Status: AC | PRN
  Administered 2021-11-25: 100 mL via INTRAVENOUS

## 2021-11-25 MED ORDER — NITROGLYCERIN 0.4 MG SL SUBL
0.8000 mg | SUBLINGUAL_TABLET | Freq: Once | SUBLINGUAL | Status: AC
  Administered 2021-11-25: 0.8 mg via SUBLINGUAL

## 2021-11-25 MED ORDER — NITROGLYCERIN 0.4 MG SL SUBL
SUBLINGUAL_TABLET | SUBLINGUAL | Status: AC
  Filled 2021-11-25: qty 2

## 2021-11-26 ENCOUNTER — Ambulatory Visit (HOSPITAL_COMMUNITY)
Admission: RE | Admit: 2021-11-26 | Discharge: 2021-11-26 | Disposition: A | Discharge status: Home / Self Care | Payer: Medicare HMO | Source: Ambulatory Visit | Attending: Cardiology | Admitting: Cardiology

## 2021-11-26 DIAGNOSIS — R931 Abnormal findings on diagnostic imaging of heart and coronary circulation: Secondary | ICD-10-CM

## 2021-11-26 DIAGNOSIS — R072 Precordial pain: Secondary | ICD-10-CM | POA: Diagnosis not present

## 2021-12-02 NOTE — Progress Notes (Signed)
Remote pacemaker transmission.   

## 2021-12-03 ENCOUNTER — Telehealth: Payer: Self-pay | Admitting: Interventional Cardiology

## 2021-12-03 NOTE — Telephone Encounter (Signed)
Pt calling in regards to test results. Please advise 

## 2021-12-03 NOTE — Telephone Encounter (Signed)
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  Await CT FFR results Spoke with pt and pt's husband re test and husband brought to my attention  FFR was not done due to technical difficulties  see addendum at the top of results Will forward to Dr Irish Lack for review and recommendations ./cy Pt and  pt's husband are wanting to know what the next step is .Adonis Housekeeper

## 2021-12-03 NOTE — Telephone Encounter (Signed)
I spoke with patient's husband and gave him information from Dr Irish Lack.  He reports patient has balance issues which make exercise difficult.  She has started using a walker and feels comfortable with this.  Husband will encourage her to walk more.  Patient is not having any chest pain.  They will let us know if she develops chest pain.  Patient has follow up with Dr Lovena Le on January 21, 2022.  I scheduled patient for follow up on April 01, 2022 with Dr Irish Lack.

## 2021-12-03 NOTE — Telephone Encounter (Signed)
Would monitor sx.  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  Continue regular walking and healthy diet. Will continue to monitor BP and cholesterol.

## 2022-01-21 ENCOUNTER — Ambulatory Visit (INDEPENDENT_AMBULATORY_CARE_PROVIDER_SITE_OTHER): Payer: Medicare HMO | Admitting: Internal Medicine

## 2022-01-21 ENCOUNTER — Encounter: Payer: Self-pay | Admitting: Internal Medicine

## 2022-01-21 VITALS — BP 130/73 | HR 89 | Ht 60.0 in | Wt 150.0 lb

## 2022-01-21 DIAGNOSIS — E782 Mixed hyperlipidemia: Secondary | ICD-10-CM | POA: Diagnosis not present

## 2022-01-21 DIAGNOSIS — I10 Essential (primary) hypertension: Secondary | ICD-10-CM

## 2022-01-21 DIAGNOSIS — I442 Atrioventricular block, complete: Secondary | ICD-10-CM | POA: Diagnosis not present

## 2022-01-21 NOTE — Patient Instructions (Addendum)
Medication Instructions:  Your physician has recommended you make the following change in your medication:  STOP TAKING LIPITOR NOW.  2.  You will speak with our pharmacist regarding injectable Lipid medication options.    Lab Work: None ordered.  If you have labs (blood work) drawn today and your tests are completely normal, you will receive your results only by: [place] (if you have MyChart) OR A paper copy in the mail If you have any lab test that is abnormal or we need to change your treatment, we will call you to review the results.  Testing/Procedures: None ordered.  Follow-Up:    PLEASE schedule an appointment for follow up with our pharmacist.  See pharm-D order.    The format for your next appointment:   In Person  Provider:   Cristopher Peru, MD{or one of the following Advanced Practice Providers on your designated Care Team:   Tommye Standard, [place] Legrand Como "Jonni Sanger" Chalmers Cater, [place]  Remote monitoring is used to monitor your Pacemaker from home. This monitoring reduces the number of office visits required to check your device to one time per year. It allows [place] to keep an eye on the functioning of your device to ensure it is working properly. You are scheduled for a device check from home on 02/14/22. You may send your transmission at any time that day. If you have a wireless device, the transmission will be sent automatically. After your physician reviews your transmission, you will receive a postcard with your next transmission date.  Important Information About [place]

## 2022-01-21 NOTE — Progress Notes (Addendum)
HPI Mrs. Brownley returns today for followup. She is a pleasant 81 yo woman with CHB, s/p PPM insertion. She underwent PM gen change out a couple of months ago. She has felt well and denies chest pain or sob. Minimal edema. She has had problems with her cholesterol. She denies syncope. She is frustrated by her inability to lose weight. Allergies  Allergen Reactions   Crestor [Rosuvastatin Calcium] Shortness Of Breath    CHEST TIGHTNESS   Rosuvastatin Shortness Of Breath    Other reaction(s): chest tightness,    Nsaids     History of bleeding ulcers   Lipitor [Atorvastatin]     MUSCLE ACHES   Penicillins Other (See Comments)    Dark stool     Current Outpatient Medications  Medication Sig Dispense Refill   acetaminophen (TYLENOL) 500 MG tablet Take 500 mg by mouth every 6 (six) hours as needed for moderate pain or headache.     Azelastine HCl 137 MCG/SPRAY SOLN Place 1 spray into both nostrils 2 (two) times daily.     carvedilol (COREG) 6.25 MG tablet Take 6.25 mg by mouth 2 (two) times daily with a meal.     Cranberry 500 MG CAPS Take 500 mg by mouth daily.     furosemide (LASIX) 40 MG tablet TAKE 1 TABLET DAILY AS NEEDED. 90 tablet 0   levocetirizine (XYZAL) 5 MG tablet Take 5 mg by mouth every evening.     levothyroxine (SYNTHROID, LEVOTHROID) 75 MCG tablet Take 75 mcg by mouth daily.     losartan (COZAAR) 100 MG tablet Take 100 mg by mouth daily.     nitroGLYCERIN (NITROSTAT) 0.4 MG SL tablet Place 1 tablet (0.4 mg total) under the tongue every 5 (five) minutes as needed for chest pain. 90 tablet 0   Omega-3 Fatty Acids (FISH OIL PO) Take 1,360 mg by mouth 2 (two) times daily.     omeprazole (PRILOSEC OTC) 20 MG tablet Take 20 mg by mouth daily as needed (indigestion).     potassium chloride SA (KLOR-CON M) 20 MEQ tablet TAKE 1 TABLET DAILY AS NEEDED. 90 tablet 0   sertraline (ZOLOFT) 50 MG tablet Take 50 mg by mouth daily.     Tetrahydrozoline HCl (VISINE OP) Place 1  drop into both eyes daily as needed (redness).     No current facility-administered medications for this visit.     Past Medical History:  Diagnosis Date   Anemia 2016   CAD (coronary artery disease)    a. LHC (2/28): Severe HK at the apex, EF 40%, diffuse luminal irregularities, no significant CAD.;  b.  Lexiscan Myoview (2/13): EF 76%, normal perfusion; low-risk study;  c.  Lexiscan Myoview (10/14):  Low risk, no ischemia, EF 75%.   Chronic systolic CHF (congestive heart failure) (HCC)    EF previously 40%; NICM; EF recovered;  Echo (03/2010): Normal LV function, EF 68.4%, apical AK, mild MR, mild TR, normal RV systolic pressure, diastolic dysfunction.   CKD (chronic kidney disease), stage III (HCC)    Complete heart block (HCC)    Diabetes mellitus without complication (HCC)    Ejection fraction < 50%    68%   Fatty liver    Gastritis    Hypertension    Hypothyroidism    Mild mitral regurgitation    NICM (nonischemic cardiomyopathy) ([place])    non-ischemic; resolved EF 75%; GSPECT   Pacemaker 2005   Medtronic [id number] for third-degree AV block   Pre-diabetes  Radiculopathy    followed by Dr. Krista Blue   Syncope     ROS:   All systems reviewed and negative except as noted in the HPI.   Past Surgical History:  Procedure Laterality Date   BREAST ENHANCEMENT SURGERY     CATARACT EXTRACTION     DOPPLER ECHOCARDIOGRAPHY  03/2010   with normal left ventricular function, ejection fraction 68%, apical akinesis and mild mitral regurgitation   ESOPHAGOGASTRODUODENOSCOPY     INTRAOCULAR LENS INSERTION     PACEMAKER GENERATOR CHANGE N/A 06/27/2011   Procedure: PACEMAKER GENERATOR CHANGE;  Surgeon: Evans Lance, MD;  Location: [facility] CATH LAB;  Service: Cardiovascular;  Laterality: N/A;   PACEMAKER INSERTION  2005   complete heart block   PPM GENERATOR CHANGEOUT N/A 10/19/2020   Procedure: PPM GENERATOR CHANGEOUT;  Surgeon: Evans Lance, MD;  Location: [facility] CV LAB;  Service:  Cardiovascular;  Laterality: N/A;   ROOT CANAL     TOTAL ABDOMINAL HYSTERECTOMY W/ BILATERAL SALPINGOOPHORECTOMY  1989   X2   UPPER GI ENDOSCOPY       Family History  Problem Relation Age of Onset   Diabetes Mother    Heart Problems Mother    Anemia Mother    Liver cancer Father    Diabetes Father    Alcoholism Father    Diabetes Sister    Heart attack Neg Hx    Stroke Neg Hx    Hypertension Neg Hx      Social History   Socioeconomic History   Marital status: Married    Spouse name: Not on file   Number of children: Not on file   Years of education: Not on file   Highest education level: Not on file  Occupational History   Not on file  Tobacco Use   Smoking status: Former    Types: Cigarettes    Quit date: 06/23/1997    Years since quitting: 24.5   Smokeless tobacco: Never  Substance and Sexual Activity   Alcohol use: No   Drug use: No   Sexual activity: Not on file  Other Topics Concern   Not on file  Social History Narrative   Not on file   Social Determinants of Health   Financial Resource Strain: Not on file  Food Insecurity: Not on file  Transportation Needs: Not on file  Physical Activity: Not on file  Stress: Not on file  Social Connections: Not on file  Intimate Partner Violence: Not on file     BP 130/73   Pulse 89   Ht 5' (1.524 m)   Wt 150 lb (68 kg)   BMI 29.29 kg/m   Physical Exam:  Well appearing NAD HEENT: Unremarkable Neck:  No JVD, no thyromegally Lymphatics:  No adenopathy Back:  No CVA tenderness Lungs:  Clear with no wheezes HEART:  Regular rate rhythm, no murmurs, no rubs, no clicks Abd:  soft, positive bowel sounds, no organomegally, no rebound, no guarding Ext:  2 plus pulses, no edema, no cyanosis, no clubbing Skin:  No rashes no nodules Neuro:  CN II through XII intact, motor grossly intact  DEVICE  Normal device function.  See PaceArt for details.   Assess/Plan:   CHB - she is s/p PPM insertion and doing  well. No escape today HTN - her bp is well controlled.  CAD - she denies anginal symptoms. She is encouraged to increase her physical activity PPM -her medtronic DDD PM is working normally. Dyslipidemia - she  will be referred to our pharmacy for consideration of repatha.   Carleene Overlie Haru Anspaugh,MD

## 2022-01-24 ENCOUNTER — Telehealth: Payer: Self-pay | Admitting: Internal Medicine

## 2022-01-24 NOTE — Telephone Encounter (Signed)
Spouse would like a callback regarding instructions on medication. He would like a call as soon as possible. Please advise

## 2022-01-24 NOTE — Telephone Encounter (Signed)
Spoke with the patient and his wife who have concerns about the patient stopping Lipitor. The patient does not have an appointment with PharmD to discuss injectables until 9/1. They are not comfortable with the patient not being on a statin until then. The patient was tolerating Lipitor fine and she would like to continue it until she is seen. She also reports that she is taking fenofibrate 67 mg daily which I have added to her list.

## 2022-01-31 DIAGNOSIS — E1121 Type 2 diabetes mellitus with diabetic nephropathy: Secondary | ICD-10-CM | POA: Diagnosis not present

## 2022-01-31 DIAGNOSIS — E785 Hyperlipidemia, unspecified: Secondary | ICD-10-CM | POA: Diagnosis not present

## 2022-02-07 DIAGNOSIS — E782 Mixed hyperlipidemia: Secondary | ICD-10-CM | POA: Diagnosis not present

## 2022-02-07 DIAGNOSIS — E1121 Type 2 diabetes mellitus with diabetic nephropathy: Secondary | ICD-10-CM | POA: Diagnosis not present

## 2022-02-07 DIAGNOSIS — M25561 Pain in right knee: Secondary | ICD-10-CM | POA: Diagnosis not present

## 2022-02-11 DIAGNOSIS — M1711 Unilateral primary osteoarthritis, right knee: Secondary | ICD-10-CM | POA: Diagnosis not present

## 2022-02-14 ENCOUNTER — Ambulatory Visit (INDEPENDENT_AMBULATORY_CARE_PROVIDER_SITE_OTHER): Payer: Medicare HMO

## 2022-02-14 DIAGNOSIS — I442 Atrioventricular block, complete: Secondary | ICD-10-CM | POA: Diagnosis not present

## 2022-02-15 LAB — CUP PACEART REMOTE DEVICE CHECK
Battery Remaining Longevity: 95 mo
Battery Voltage: 3 V
Brady Statistic AP VP Percent: 60.16 %
Brady Statistic AP VS Percent: 0.08 %
Brady Statistic AS VP Percent: 39.61 %
Brady Statistic AS VS Percent: 0.14 %
Brady Statistic RA Percent Paced: 60.24 %
Brady Statistic RV Percent Paced: 99.77 %
Date Time Interrogation Session: 20230828214714
Implantable Lead Implant Date: 20130107
Implantable Lead Implant Date: 20130107
Implantable Lead Location: 753859
Implantable Lead Location: 753860
Implantable Lead Model: 5076
Implantable Lead Model: 5092
Implantable Pulse Generator Implant Date: 20220502
Lead Channel Impedance Value: 361 Ohm
Lead Channel Impedance Value: 361 Ohm
Lead Channel Impedance Value: 399 Ohm
Lead Channel Impedance Value: 399 Ohm
Lead Channel Pacing Threshold Amplitude: 1.25 V
Lead Channel Pacing Threshold Amplitude: 1.375 V
Lead Channel Pacing Threshold Pulse Width: 0.4 ms
Lead Channel Pacing Threshold Pulse Width: 0.4 ms
Lead Channel Sensing Intrinsic Amplitude: 1.875 mV
Lead Channel Sensing Intrinsic Amplitude: 1.875 mV
Lead Channel Sensing Intrinsic Amplitude: 3.625 mV
Lead Channel Setting Pacing Amplitude: 2.75 V
Lead Channel Setting Pacing Amplitude: 2.75 V
Lead Channel Setting Pacing Pulse Width: 0.4 ms
Lead Channel Setting Sensing Sensitivity: 0.9 mV

## 2022-02-18 ENCOUNTER — Ambulatory Visit: Payer: Medicare HMO | Attending: Cardiology | Admitting: Pharmacist

## 2022-02-18 DIAGNOSIS — E782 Mixed hyperlipidemia: Secondary | ICD-10-CM | POA: Diagnosis not present

## 2022-02-18 MED ORDER — EZETIMIBE 10 MG PO TABS: 10.0000 mg | ORAL_TABLET | Freq: Every day | ORAL | 3 refills | Status: DC

## 2022-02-18 NOTE — Progress Notes (Unsigned)
Patient ID: Gabriela Phillips                 DOB: 1940/07/21                    MRN: [id number]      HPI: Gabriela Phillips is a 81 y.o. female patient referred to lipid clinic by Dr. Lovena Le. PMH is significant for CAD, chest pain, CHF, CKD, DM, HTN, PPM and HLD. Patient saw Dr. Lovena Le on 8/4 and expressed concern over her weight and atrovastatin.  Patient presents today accompanied by her husband. She is originally from [place]. Rice is a big staple. She is upset because her A1C is 6.7. She reports that their was a misunderstanding and she is not having any issues with atorvastatin. She did have issues with rosuvastatin. LDL-C on last check in Aug was 66. TG down to 201 on fenofibrate 54. She is upset because the doctors want to start her on medications for her DM and she wants to try to lose weight on her own. Husband explains that Dr. Lovena Le was only making suggestions based on her concern about her weight.  Patient eats rice at all 3 meals. She does eat a lot of vegetables every day. Exercise is limited due to knee. Bone on bone is how she describes it. Knee brace has helped.  Current Medications: atorvastatin '40mg'$  daily, fenofibrate '54mg'$  daily Intolerances: rosuvastatin (chest tightness) atorvastatin (muscle aches) Risk Factors: CAD, CKD, DM LDL goal: <55  Diet: breakfast: brown/white rice 50/50, beef, pork or chicken, Protein drink with almond milk, blueberries, banana, buckwheat , vegetable Lunch: buckwheat, rice, vegetable Dinner: rice,  Eats out 3 times a week Drink: water, 8 oz almond milk (unsweetened) Mostly controls her sweet intake  Exercise: limited due to knee pain  Family History: The patient's family history includes Alcoholism in her father; Anemia in her mother; Diabetes in her father, mother, and sister; Heart Problems in her mother; Liver cancer in her father.    Social History: no alcohol   Labs: 01/31/22 TC 146, HDL 47, LDL-C 66, TG 201 (atorvastatin '40mg'$  daily,  fenofibrate '54mg'$  daily)  Past Medical History:  Diagnosis Date   Anemia 2016   CAD (coronary artery disease)    a. LHC (2/28): Severe HK at the apex, EF 40%, diffuse luminal irregularities, no significant CAD.;  b.  Lexiscan Myoview (2/13): EF 76%, normal perfusion; low-risk study;  c.  Lexiscan Myoview (10/14):  Low risk, no ischemia, EF 75%.   Chronic systolic CHF (congestive heart failure) (HCC)    EF previously 40%; NICM; EF recovered;  Echo (03/2010): Normal LV function, EF 68.4%, apical AK, mild MR, mild TR, normal RV systolic pressure, diastolic dysfunction.   CKD (chronic kidney disease), stage III (HCC)    Complete heart block (HCC)    Diabetes mellitus without complication (HCC)    Ejection fraction < 50%    68%   Fatty liver    Gastritis    Hypertension    Hypothyroidism    Mild mitral regurgitation    NICM (nonischemic cardiomyopathy) ([place])    non-ischemic; resolved EF 75%; GSPECT   Pacemaker 2005   Medtronic [id number] for third-degree AV block   Pre-diabetes    Radiculopathy    followed by Dr. Krista Blue   Syncope     Current Outpatient Medications on File Prior to Visit  Medication Sig Dispense Refill   acetaminophen (TYLENOL) 500 MG tablet Take 500 mg by mouth every 6 (  six) hours as needed for moderate pain or headache.     Azelastine HCl 137 MCG/SPRAY SOLN Place 1 spray into both nostrils 2 (two) times daily.     carvedilol (COREG) 6.25 MG tablet Take 6.25 mg by mouth 2 (two) times daily with a meal.     Cranberry 500 MG CAPS Take 500 mg by mouth daily.     fenofibrate micronized (LOFIBRA) 67 MG capsule Take 67 mg by mouth daily before breakfast.     furosemide (LASIX) 40 MG tablet TAKE 1 TABLET DAILY AS NEEDED. 90 tablet 0   levocetirizine (XYZAL) 5 MG tablet Take 5 mg by mouth every evening.     levothyroxine (SYNTHROID, LEVOTHROID) 75 MCG tablet Take 75 mcg by mouth daily.     losartan (COZAAR) 100 MG tablet Take 100 mg by mouth daily.     nitroGLYCERIN (NITROSTAT)  0.4 MG SL tablet Place 1 tablet (0.4 mg total) under the tongue every 5 (five) minutes as needed for chest pain. 90 tablet 0   Omega-3 Fatty Acids (FISH OIL PO) Take 1,360 mg by mouth 2 (two) times daily.     omeprazole (PRILOSEC OTC) 20 MG tablet Take 20 mg by mouth daily as needed (indigestion).     potassium chloride SA (KLOR-CON M) 20 MEQ tablet TAKE 1 TABLET DAILY AS NEEDED. 90 tablet 0   sertraline (ZOLOFT) 50 MG tablet Take 50 mg by mouth daily.     Tetrahydrozoline HCl (VISINE OP) Place 1 drop into both eyes daily as needed (redness).     No current facility-administered medications on file prior to visit.    Allergies  Allergen Reactions   Crestor [Rosuvastatin Calcium] Shortness Of Breath    CHEST TIGHTNESS   Rosuvastatin Shortness Of Breath    Other reaction(s): chest tightness,    Nsaids     History of bleeding ulcers   Lipitor [Atorvastatin]     MUSCLE ACHES   Penicillins Other (See Comments)    Dark stool    Assessment/Plan:  1. Hyperlipidemia - LDL-C is above goal of <55. We discussed adding ezetimibe. Insurance wouldn't cover Repatha bc LDL-C is <70 and it is expensive. Will start ezetimbie '10mg'$  daily. Continue atorvastatin '40mg'$  daily and fenofibrate. We did also discuss Vascepa. She will think about it. I encouraged patient to stick with lean meats and to increase exercise as she can. Suggested a desk cycle that she peddles with arm or walking in a pool. Recheck lipids 12/1.  2. DM- Patient wants to work on lifestlye which I strongly encouraged. We did discuss all the medication options that Dr. Hall Busing had suggested and reviewed the benefits and cost.    Thank you,   Ramond Dial, Pharm.D, BCPS, CPP [facility]  [address] [place], [place] [address]  Phone: [phone]; Fax: [phone]

## 2022-02-18 NOTE — Patient Instructions (Addendum)
Ozempic - injection that has been shown to help people lose weight, decrease blood sugar, and decrease the risk of heart attack and stroke. Cost is $45/month or $125/90 days  Jardiance or Farxiga- pill that can help decrease blood sugar, protect the kidneys and help reduce the risk of hospitalization for heart failure. Cost is $45/month or $125/90 days  Ezetimibe- pill that reduces risk of heart attack and stroke. Lowers LDL-C 20% and triglycerides a little bit  Vascepa- prescription fish oil that lowers triglycerides and reduces the risk of heart attack and stroke. Cost is $45/month or $125/90 days    Continue taking atorvastatin '40mg'$  daily. Start taking ezetimibe '10mg'$  daily. Come for fasting labs on Dec 1

## 2022-03-01 ENCOUNTER — Telehealth: Payer: Self-pay | Admitting: Pharmacist

## 2022-03-01 NOTE — Telephone Encounter (Signed)
Patient's husband, with patient in the background called to say that patient took ezetimibe for 2-3 days and feels terrible. Reports headache, feeling hot and congestion. I asked if patient has taken a COVID test, as symptoms sound like an infection possibly. They deny taking a COVID test. Advised it is ok to stop, but if she does not feel better, she should call her PCP. Requested they call me in a week or two to let me know how she is feeling.

## 2022-03-08 DIAGNOSIS — M8580 Other specified disorders of bone density and structure, unspecified site: Secondary | ICD-10-CM | POA: Diagnosis not present

## 2022-03-08 DIAGNOSIS — I5032 Chronic diastolic (congestive) heart failure: Secondary | ICD-10-CM | POA: Diagnosis not present

## 2022-03-08 DIAGNOSIS — I10 Essential (primary) hypertension: Secondary | ICD-10-CM | POA: Diagnosis not present

## 2022-03-08 DIAGNOSIS — E039 Hypothyroidism, unspecified: Secondary | ICD-10-CM | POA: Diagnosis not present

## 2022-03-08 DIAGNOSIS — N183 Chronic kidney disease, stage 3 unspecified: Secondary | ICD-10-CM | POA: Diagnosis not present

## 2022-03-08 DIAGNOSIS — E1121 Type 2 diabetes mellitus with diabetic nephropathy: Secondary | ICD-10-CM | POA: Diagnosis not present

## 2022-03-08 DIAGNOSIS — E782 Mixed hyperlipidemia: Secondary | ICD-10-CM | POA: Diagnosis not present

## 2022-03-10 NOTE — Progress Notes (Signed)
Remote pacemaker transmission.   

## 2022-03-31 NOTE — Progress Notes (Signed)
Cardiology Office Note   Date:  04/01/2022   ID:  Gabriela, Phillips Dec 07, 1940, MRN [id number]  PCP:  Lawerance Cruel, MD    No chief complaint on file.  Chronic diastolic heart failure  Wt Readings from Last 3 Encounters:  04/01/22 152 lb (68.9 kg)  01/21/22 150 lb (68 kg)  11/05/21 150 lb (68 kg)       History of Present Illness: Gabriela Phillips is a 81 y.o. female  who has had a pacer placed. SHe has nonobstructive CAD by cath in 2008.  SHe had some chest pain and then a negative stress test in 10/14.     In the past, it was noted: "Overall, she feels well.  She remains active.  She tries to eat healthy and watch portion sizes."    She has been vaccinated for COVID.   In 2021, She decreased the frequency of Lasix due to lack of swelling since weight loss.    She had battery change June 2022.  In May 2023: "She has had some DOE.   Worse in hot weather.  OK in room temperature.  SL NTG. Knee pain limits walking.  Only walking is for shopping.  No regular exercise.  Got some gel shots.  Diagnosed with arthritis."   6/23 CTA: "Coronary calcium score of 1068. This was 32 percentile for age-, sex, and race-matched controls.   2. Normal coronary origin with right dominance.   3. Severe (70-99) stenosis proximal D2; moderate (50-69) stenoses in LAD, Lcx, RCA and PDA; mild (25-49) stenoses in LM.   4. Aortic atherosclerosis."  Feels tired when she takes the diuretic.  Takes it for swelling.    Denies : Chest pain. Dizziness. Leg edema. Nitroglycerin use. Orthopnea. Palpitations. Paroxysmal nocturnal dyspnea. Shortness of breath. Syncope.   She does not want to take fish oil.  Apparently, her triglycerides improved with the fenofibrate only.  When she started fenofibrate, she stopped fish oil.    Past Medical History:  Diagnosis Date   Anemia 2016   CAD (coronary artery disease)    a. LHC (2/28): Severe HK at the apex, EF 40%, diffuse luminal  irregularities, no significant CAD.;  b.  Lexiscan Myoview (2/13): EF 76%, normal perfusion; low-risk study;  c.  Lexiscan Myoview (10/14):  Low risk, no ischemia, EF 75%.   Chronic systolic CHF (congestive heart failure) (HCC)    EF previously 40%; NICM; EF recovered;  Echo (03/2010): Normal LV function, EF 68.4%, apical AK, mild MR, mild TR, normal RV systolic pressure, diastolic dysfunction.   CKD (chronic kidney disease), stage III (HCC)    Complete heart block (HCC)    Diabetes mellitus without complication (HCC)    Ejection fraction < 50%    68%   Fatty liver    Gastritis    Hypertension    Hypothyroidism    Mild mitral regurgitation    NICM (nonischemic cardiomyopathy) ([place])    non-ischemic; resolved EF 75%; GSPECT   Pacemaker 2005   Medtronic [id number] for third-degree AV block   Pre-diabetes    Radiculopathy    followed by Dr. Krista Phillips   Syncope     Past Surgical History:  Procedure Laterality Date   BREAST ENHANCEMENT SURGERY     CATARACT EXTRACTION     DOPPLER ECHOCARDIOGRAPHY  03/2010   with normal left ventricular function, ejection fraction 68%, apical akinesis and mild mitral regurgitation   ESOPHAGOGASTRODUODENOSCOPY     INTRAOCULAR LENS INSERTION  PACEMAKER GENERATOR CHANGE N/A 06/27/2011   Procedure: PACEMAKER GENERATOR CHANGE;  Surgeon: Evans Lance, MD;  Location: [facility] CATH LAB;  Service: Cardiovascular;  Laterality: N/A;   PACEMAKER INSERTION  2005   complete heart block   PPM GENERATOR CHANGEOUT N/A 10/19/2020   Procedure: PPM GENERATOR CHANGEOUT;  Surgeon: Evans Lance, MD;  Location: [facility] CV LAB;  Service: Cardiovascular;  Laterality: N/A;   ROOT CANAL     TOTAL ABDOMINAL HYSTERECTOMY W/ BILATERAL SALPINGOOPHORECTOMY  1989   X2   UPPER GI ENDOSCOPY       Current Outpatient Medications  Medication Sig Dispense Refill   acetaminophen (TYLENOL) 500 MG tablet Take 500 mg by mouth every 6 (six) hours as needed for mild pain, fever or headache.      acetaminophen (TYLENOL) 650 MG CR tablet Take 1,300 mg by mouth daily.     atorvastatin (LIPITOR) 40 MG tablet Take 1 tablet (40 mg total) by mouth daily. 90 tablet 3   Azelastine HCl 137 MCG/SPRAY SOLN Place 1 spray into both nostrils 2 (two) times daily.     carvedilol (COREG) 6.25 MG tablet Take 6.25 mg by mouth 2 (two) times daily with a meal.     Cranberry 500 MG CAPS Take 500 mg by mouth daily.     fenofibrate micronized (LOFIBRA) 67 MG capsule Take 67 mg by mouth daily before breakfast.     furosemide (LASIX) 40 MG tablet TAKE 1 TABLET DAILY AS NEEDED. 90 tablet 0   levocetirizine (XYZAL) 5 MG tablet Take 5 mg by mouth every evening.     levothyroxine (SYNTHROID, LEVOTHROID) 75 MCG tablet Take 75 mcg by mouth daily.     losartan (COZAAR) 100 MG tablet Take 100 mg by mouth daily.     nitroGLYCERIN (NITROSTAT) 0.4 MG SL tablet Place 1 tablet (0.4 mg total) under the tongue every 5 (five) minutes as needed for chest pain. 90 tablet 0   Omega-3 Fatty Acids (FISH OIL PO) Take 1,360 mg by mouth 2 (two) times daily.     omeprazole (PRILOSEC OTC) 20 MG tablet Take 20 mg by mouth daily as needed (indigestion).     potassium chloride SA (KLOR-CON M) 20 MEQ tablet TAKE 1 TABLET DAILY AS NEEDED. 90 tablet 0   sertraline (ZOLOFT) 50 MG tablet Take 50 mg by mouth daily.     Tetrahydrozoline HCl (VISINE OP) Place 1 drop into both eyes daily as needed (redness).     No current facility-administered medications for this visit.    Allergies:   Crestor [rosuvastatin calcium], Rosuvastatin, Nsaids, and Penicillins    Social History:  The patient  reports that she quit smoking about 24 years ago. Her smoking use included cigarettes. She has never used smokeless tobacco. She reports that she does not drink alcohol and does not use drugs.   Family History:  The patient's family history includes Alcoholism in her father; Anemia in her mother; Diabetes in her father, mother, and sister; Heart Problems in her  mother; Liver cancer in her father.    ROS:  Please see the history of present illness.   Otherwise, review of systems are positive for fatigue wen she takes Lasix.   All other systems are reviewed and negative.    PHYSICAL EXAM: VS:  BP 120/60   Pulse 72   Ht 5' (1.524 m)   Wt 152 lb (68.9 kg)   SpO2 96%   BMI 29.69 kg/m  , BMI Body mass index  is 29.69 kg/m. GEN: Well nourished, well developed, in no acute distress HEENT: normal Neck: no JVD, carotid bruits, or masses Cardiac: RRR; no murmurs, rubs, or gallops,no edema  Respiratory:  clear to auscultation bilaterally, normal work of breathing GI: soft, nontender, nondistended, + BS MS: no deformity or atrophy; right knee brace in place Skin: warm and dry, no rash Neuro:  Strength and sensation are intact Psych: euthymic mood, full affect   EKG:   The ekg ordered May demonstrates AV sequential pacing   Recent Labs: 11/05/2021: BUN 25; Creatinine, Ser 0.97; Potassium 4.9; Sodium 143   Lipid Panel No results found for: "CHOL", "TRIG", "HDL", "CHOLHDL", "VLDL", "LDLCALC", "LDLDIRECT"   Other studies Reviewed: Additional studies/ records that were reviewed today with results demonstrating: labs reviewed.   ASSESSMENT AND PLAN:  Chronic diastolic heart failure: Limit salt intake.  Continue diuretic therapy.  Stop lasix.  Start torsemide 20 mg daily. Can take 10 mg on some days if fluid feels decreased.  Exercise limited by knee pain.  Partial TKR recommended but sheis concerned about the recovery.   I stressed the importance of trying to get some activity.  CAD: Moderate by coronary CTA.  We discussed her CTA results.  Moderate disease in the main vessels.  She did have a severe, branch vessel lesion noted. HTN: Controlled at home.  Low-salt diet. Pacer: Follows with the EP. Hyperlipidemia: Did not tolerate Zetia.  Did well with fenofibrate and triglycerides decreased significantly.  Was not on fish oil when the triglycerides  were 201. DM: Whole food, plant-based diet.  High-fiber diet.  Avoid processed foods.  A1C 6.7.  Consider metformin.   Current medicines are reviewed at length with the patient today.  The patient concerns regarding her medicines were addressed.  The following changes have been made:  No change  Labs/ tests ordered today include:  No orders of the defined types were placed in this encounter.   Recommend 150 minutes/week of aerobic exercise Low fat, low carb, high fiber diet recommended  Disposition:   FU in 6 months   Signed, Larae Grooms, MD  04/01/2022 2:09 PM    [facility] [address], [place], [place]  [address] Phone: [phone]; Fax: [phone]

## 2022-04-01 ENCOUNTER — Ambulatory Visit: Payer: Medicare HMO | Attending: Interventional Cardiology | Admitting: Interventional Cardiology

## 2022-04-01 ENCOUNTER — Encounter: Payer: Self-pay | Admitting: Interventional Cardiology

## 2022-04-01 VITALS — BP 120/60 | HR 72 | Ht 60.0 in | Wt 152.0 lb

## 2022-04-01 DIAGNOSIS — Z950 Presence of cardiac pacemaker: Secondary | ICD-10-CM

## 2022-04-01 DIAGNOSIS — I5032 Chronic diastolic (congestive) heart failure: Secondary | ICD-10-CM | POA: Diagnosis not present

## 2022-04-01 DIAGNOSIS — E782 Mixed hyperlipidemia: Secondary | ICD-10-CM

## 2022-04-01 DIAGNOSIS — I10 Essential (primary) hypertension: Secondary | ICD-10-CM

## 2022-04-01 MED ORDER — TORSEMIDE 20 MG PO TABS: 20.0000 mg | ORAL_TABLET | Freq: Every day | ORAL | 2 refills | Status: DC

## 2022-04-01 NOTE — Patient Instructions (Addendum)
Medication Instructions:  Your physician has recommended you make the following change in your medication:  Stop furosemide. Start torsemide 20 mg by mouth daily. Stop fish oil  *If you need a refill on your cardiac medications before your next appointment, please call your pharmacy*   Lab Work: Your physician recommends that you return for lab work on April 15, 2022. This is not fasting.  The lab is open from 7:30 AM to 4:30 PM  If you have labs (blood work) drawn today and your tests are completely normal, you will receive your results only by: [facility] (if you have MyChart) OR A paper copy in the mail If you have any lab test that is abnormal or we need to change your treatment, we will call you to review the results.   Testing/Procedures: none   Follow-Up: At [facility], you and your health needs are our priority.  As part of our continuing mission to provide you with exceptional heart care, we have created designated Provider Care Teams.  These Care Teams include your primary Cardiologist (physician) and Advanced Practice Providers (APPs -  Physician Assistants and Nurse Practitioners) who all work together to provide you with the care you need, when you need it.  We recommend signing up for the patient portal called "MyChart".  Sign up information is provided on this After Visit Summary.  MyChart is used to connect with patients for Virtual Visits (Telemedicine).  Patients are able to view lab/test results, encounter notes, upcoming appointments, etc.  Non-urgent messages can be sent to your provider as well.   To learn more about what you can do with MyChart, go to [email or web address].    Your next appointment:   6 month(s)  The format for your next appointment:   In Person  Provider:   Larae Grooms, MD     Other Instructions   Important Information About Sugar

## 2022-04-15 ENCOUNTER — Ambulatory Visit: Payer: Medicare HMO | Attending: Interventional Cardiology

## 2022-04-15 DIAGNOSIS — I5032 Chronic diastolic (congestive) heart failure: Secondary | ICD-10-CM

## 2022-04-15 DIAGNOSIS — I10 Essential (primary) hypertension: Secondary | ICD-10-CM

## 2022-04-16 LAB — BASIC METABOLIC PANEL
BUN/Creatinine Ratio: 23 (ref 12–28)
BUN: 27 mg/dL (ref 8–27)
CO2: 22 mmol/L (ref 20–29)
Calcium: 9.8 mg/dL (ref 8.7–10.3)
Chloride: 103 mmol/L (ref 96–106)
Creatinine, Ser: 1.19 mg/dL — ABNORMAL HIGH (ref 0.57–1.00)
Glucose: 127 mg/dL — ABNORMAL HIGH (ref 70–99)
Potassium: 4.7 mmol/L (ref 3.5–5.2)
Sodium: 145 mmol/L — ABNORMAL HIGH (ref 134–144)
eGFR: 46 mL/min/{1.73_m2} — ABNORMAL LOW (ref >59)

## 2022-04-29 DIAGNOSIS — Z1231 Encounter for screening mammogram for malignant neoplasm of breast: Secondary | ICD-10-CM | POA: Diagnosis not present

## 2022-05-10 DIAGNOSIS — N183 Chronic kidney disease, stage 3 unspecified: Secondary | ICD-10-CM | POA: Diagnosis not present

## 2022-05-10 DIAGNOSIS — I10 Essential (primary) hypertension: Secondary | ICD-10-CM | POA: Diagnosis not present

## 2022-05-10 DIAGNOSIS — E782 Mixed hyperlipidemia: Secondary | ICD-10-CM | POA: Diagnosis not present

## 2022-05-10 DIAGNOSIS — I5032 Chronic diastolic (congestive) heart failure: Secondary | ICD-10-CM | POA: Diagnosis not present

## 2022-05-10 DIAGNOSIS — E039 Hypothyroidism, unspecified: Secondary | ICD-10-CM | POA: Diagnosis not present

## 2022-05-10 DIAGNOSIS — E1121 Type 2 diabetes mellitus with diabetic nephropathy: Secondary | ICD-10-CM | POA: Diagnosis not present

## 2022-05-16 ENCOUNTER — Ambulatory Visit (INDEPENDENT_AMBULATORY_CARE_PROVIDER_SITE_OTHER): Payer: Medicare HMO

## 2022-05-16 DIAGNOSIS — M1711 Unilateral primary osteoarthritis, right knee: Secondary | ICD-10-CM | POA: Diagnosis not present

## 2022-05-16 DIAGNOSIS — M545 Low back pain, unspecified: Secondary | ICD-10-CM | POA: Diagnosis not present

## 2022-05-16 DIAGNOSIS — I442 Atrioventricular block, complete: Secondary | ICD-10-CM | POA: Diagnosis not present

## 2022-05-17 LAB — CUP PACEART REMOTE DEVICE CHECK
Battery Remaining Longevity: 90 mo
Battery Voltage: 2.99 V
Brady Statistic AP VP Percent: 59.9 %
Brady Statistic AP VS Percent: 0.01 %
Brady Statistic AS VP Percent: 40.01 %
Brady Statistic AS VS Percent: 0.08 %
Brady Statistic RA Percent Paced: 59.89 %
Brady Statistic RV Percent Paced: 99.9 %
Date Time Interrogation Session: 20231127042747
Implantable Lead Connection Status: 753985
Implantable Lead Connection Status: 753985
Implantable Lead Implant Date: 20130107
Implantable Lead Implant Date: 20130107
Implantable Lead Location: 753859
Implantable Lead Location: 753860
Implantable Lead Model: 5076
Implantable Lead Model: 5092
Implantable Pulse Generator Implant Date: 20220502
Lead Channel Impedance Value: 380 Ohm
Lead Channel Impedance Value: 437 Ohm
Lead Channel Impedance Value: 456 Ohm
Lead Channel Impedance Value: 456 Ohm
Lead Channel Pacing Threshold Amplitude: 1.375 V
Lead Channel Pacing Threshold Amplitude: 1.5 V
Lead Channel Pacing Threshold Pulse Width: 0.4 ms
Lead Channel Pacing Threshold Pulse Width: 0.4 ms
Lead Channel Sensing Intrinsic Amplitude: 3.375 mV
Lead Channel Sensing Intrinsic Amplitude: 3.375 mV
Lead Channel Sensing Intrinsic Amplitude: 3.625 mV
Lead Channel Setting Pacing Amplitude: 3 V
Lead Channel Setting Pacing Amplitude: 3 V
Lead Channel Setting Pacing Pulse Width: 0.4 ms
Lead Channel Setting Sensing Sensitivity: 0.9 mV
Zone Setting Status: 755011

## 2022-05-20 ENCOUNTER — Other Ambulatory Visit: Payer: Self-pay | Admitting: Internal Medicine

## 2022-05-20 ENCOUNTER — Other Ambulatory Visit: Payer: Medicare HMO

## 2022-05-23 ENCOUNTER — Other Ambulatory Visit: Payer: Self-pay

## 2022-05-23 MED ORDER — NITROGLYCERIN 0.4 MG SL SUBL: 0.4000 mg | SUBLINGUAL_TABLET | SUBLINGUAL | 0 refills | Status: AC | PRN

## 2022-06-27 NOTE — Progress Notes (Signed)
Remote pacemaker transmission.   

## 2022-07-25 DIAGNOSIS — D62 Acute posthemorrhagic anemia: Secondary | ICD-10-CM | POA: Diagnosis not present

## 2022-07-25 DIAGNOSIS — E039 Hypothyroidism, unspecified: Secondary | ICD-10-CM | POA: Diagnosis not present

## 2022-07-25 DIAGNOSIS — E1121 Type 2 diabetes mellitus with diabetic nephropathy: Secondary | ICD-10-CM | POA: Diagnosis not present

## 2022-07-25 DIAGNOSIS — E782 Mixed hyperlipidemia: Secondary | ICD-10-CM | POA: Diagnosis not present

## 2022-07-25 DIAGNOSIS — I10 Essential (primary) hypertension: Secondary | ICD-10-CM | POA: Diagnosis not present

## 2022-08-02 DIAGNOSIS — J309 Allergic rhinitis, unspecified: Secondary | ICD-10-CM | POA: Diagnosis not present

## 2022-08-02 DIAGNOSIS — E1122 Type 2 diabetes mellitus with diabetic chronic kidney disease: Secondary | ICD-10-CM | POA: Diagnosis not present

## 2022-08-02 DIAGNOSIS — Z23 Encounter for immunization: Secondary | ICD-10-CM | POA: Diagnosis not present

## 2022-08-02 DIAGNOSIS — Z0000 Encounter for general adult medical examination without abnormal findings: Secondary | ICD-10-CM | POA: Diagnosis not present

## 2022-08-02 DIAGNOSIS — E039 Hypothyroidism, unspecified: Secondary | ICD-10-CM | POA: Diagnosis not present

## 2022-08-02 DIAGNOSIS — E782 Mixed hyperlipidemia: Secondary | ICD-10-CM | POA: Diagnosis not present

## 2022-08-02 DIAGNOSIS — N183 Chronic kidney disease, stage 3 unspecified: Secondary | ICD-10-CM | POA: Diagnosis not present

## 2022-08-02 DIAGNOSIS — I10 Essential (primary) hypertension: Secondary | ICD-10-CM | POA: Diagnosis not present

## 2022-08-02 DIAGNOSIS — Z950 Presence of cardiac pacemaker: Secondary | ICD-10-CM | POA: Diagnosis not present

## 2022-08-04 ENCOUNTER — Other Ambulatory Visit: Payer: Self-pay

## 2022-08-04 MED ORDER — TORSEMIDE 20 MG PO TABS: 20.0000 mg | ORAL_TABLET | Freq: Every day | ORAL | 2 refills | Status: DC

## 2022-08-15 ENCOUNTER — Ambulatory Visit: Payer: Medicare HMO

## 2022-08-15 DIAGNOSIS — I442 Atrioventricular block, complete: Secondary | ICD-10-CM

## 2022-08-16 LAB — CUP PACEART REMOTE DEVICE CHECK
Battery Remaining Longevity: 91 mo
Battery Voltage: 2.99 V
Brady Statistic AP VP Percent: 51.81 %
Brady Statistic AP VS Percent: 0 %
Brady Statistic AS VP Percent: 48.1 %
Brady Statistic AS VS Percent: 0.08 %
Brady Statistic RA Percent Paced: 51.81 %
Brady Statistic RV Percent Paced: 99.92 %
Date Time Interrogation Session: 20240226054523
Implantable Lead Connection Status: 753985
Implantable Lead Connection Status: 753985
Implantable Lead Implant Date: 20130107
Implantable Lead Implant Date: 20130107
Implantable Lead Location: 753859
Implantable Lead Location: 753860
Implantable Lead Model: 5076
Implantable Lead Model: 5092
Implantable Pulse Generator Implant Date: 20220502
Lead Channel Impedance Value: 399 Ohm
Lead Channel Impedance Value: 437 Ohm
Lead Channel Impedance Value: 456 Ohm
Lead Channel Impedance Value: 475 Ohm
Lead Channel Pacing Threshold Amplitude: 1.25 V
Lead Channel Pacing Threshold Amplitude: 1.5 V
Lead Channel Pacing Threshold Pulse Width: 0.4 ms
Lead Channel Pacing Threshold Pulse Width: 0.4 ms
Lead Channel Sensing Intrinsic Amplitude: 2.25 mV
Lead Channel Sensing Intrinsic Amplitude: 2.25 mV
Lead Channel Sensing Intrinsic Amplitude: 3.625 mV
Lead Channel Setting Pacing Amplitude: 2.75 V
Lead Channel Setting Pacing Amplitude: 3 V
Lead Channel Setting Pacing Pulse Width: 0.4 ms
Lead Channel Setting Sensing Sensitivity: 0.9 mV
Zone Setting Status: 755011

## 2022-08-29 DIAGNOSIS — E782 Mixed hyperlipidemia: Secondary | ICD-10-CM | POA: Diagnosis not present

## 2022-08-29 DIAGNOSIS — N183 Chronic kidney disease, stage 3 unspecified: Secondary | ICD-10-CM | POA: Diagnosis not present

## 2022-08-29 DIAGNOSIS — E039 Hypothyroidism, unspecified: Secondary | ICD-10-CM | POA: Diagnosis not present

## 2022-08-29 DIAGNOSIS — I10 Essential (primary) hypertension: Secondary | ICD-10-CM | POA: Diagnosis not present

## 2022-08-29 DIAGNOSIS — I5032 Chronic diastolic (congestive) heart failure: Secondary | ICD-10-CM | POA: Diagnosis not present

## 2022-08-29 DIAGNOSIS — M8580 Other specified disorders of bone density and structure, unspecified site: Secondary | ICD-10-CM | POA: Diagnosis not present

## 2022-08-29 DIAGNOSIS — M1711 Unilateral primary osteoarthritis, right knee: Secondary | ICD-10-CM | POA: Diagnosis not present

## 2022-08-29 DIAGNOSIS — E1121 Type 2 diabetes mellitus with diabetic nephropathy: Secondary | ICD-10-CM | POA: Diagnosis not present

## 2022-09-26 NOTE — Progress Notes (Signed)
Remote pacemaker transmission.   

## 2022-10-18 DIAGNOSIS — Z961 Presence of intraocular lens: Secondary | ICD-10-CM | POA: Diagnosis not present

## 2022-10-18 DIAGNOSIS — H52203 Unspecified astigmatism, bilateral: Secondary | ICD-10-CM | POA: Diagnosis not present

## 2022-10-18 DIAGNOSIS — E119 Type 2 diabetes mellitus without complications: Secondary | ICD-10-CM | POA: Diagnosis not present

## 2022-11-15 ENCOUNTER — Ambulatory Visit (INDEPENDENT_AMBULATORY_CARE_PROVIDER_SITE_OTHER): Payer: Medicare HMO

## 2022-11-15 DIAGNOSIS — I442 Atrioventricular block, complete: Secondary | ICD-10-CM | POA: Diagnosis not present

## 2022-11-16 LAB — CUP PACEART REMOTE DEVICE CHECK
Battery Remaining Longevity: 78 mo
Battery Voltage: 2.98 V
Brady Statistic AP VP Percent: 50.16 %
Brady Statistic AP VS Percent: 0.01 %
Brady Statistic AS VP Percent: 49.76 %
Brady Statistic AS VS Percent: 0.08 %
Brady Statistic RA Percent Paced: 50.15 %
Brady Statistic RV Percent Paced: 99.92 %
Date Time Interrogation Session: 20240528020834
Implantable Lead Connection Status: 753985
Implantable Lead Connection Status: 753985
Implantable Lead Implant Date: 20130107
Implantable Lead Implant Date: 20130107
Implantable Lead Location: 753859
Implantable Lead Location: 753860
Implantable Lead Model: 5076
Implantable Lead Model: 5092
Implantable Pulse Generator Implant Date: 20220502
Lead Channel Impedance Value: 361 Ohm
Lead Channel Impedance Value: 399 Ohm
Lead Channel Impedance Value: 399 Ohm
Lead Channel Impedance Value: 418 Ohm
Lead Channel Pacing Threshold Amplitude: 1.5 V
Lead Channel Pacing Threshold Amplitude: 1.625 V
Lead Channel Pacing Threshold Pulse Width: 0.4 ms
Lead Channel Pacing Threshold Pulse Width: 0.4 ms
Lead Channel Sensing Intrinsic Amplitude: 2.25 mV
Lead Channel Sensing Intrinsic Amplitude: 2.25 mV
Lead Channel Sensing Intrinsic Amplitude: 3.625 mV
Lead Channel Setting Pacing Amplitude: 3 V
Lead Channel Setting Pacing Amplitude: 3.25 V
Lead Channel Setting Pacing Pulse Width: 0.4 ms
Lead Channel Setting Sensing Sensitivity: 0.9 mV
Zone Setting Status: 755011

## 2022-11-17 DIAGNOSIS — E039 Hypothyroidism, unspecified: Secondary | ICD-10-CM | POA: Diagnosis not present

## 2022-11-17 DIAGNOSIS — E1121 Type 2 diabetes mellitus with diabetic nephropathy: Secondary | ICD-10-CM | POA: Diagnosis not present

## 2022-11-17 DIAGNOSIS — N183 Chronic kidney disease, stage 3 unspecified: Secondary | ICD-10-CM | POA: Diagnosis not present

## 2022-11-17 DIAGNOSIS — E782 Mixed hyperlipidemia: Secondary | ICD-10-CM | POA: Diagnosis not present

## 2022-11-17 DIAGNOSIS — M1711 Unilateral primary osteoarthritis, right knee: Secondary | ICD-10-CM | POA: Diagnosis not present

## 2022-11-17 DIAGNOSIS — I5032 Chronic diastolic (congestive) heart failure: Secondary | ICD-10-CM | POA: Diagnosis not present

## 2022-11-17 DIAGNOSIS — I10 Essential (primary) hypertension: Secondary | ICD-10-CM | POA: Diagnosis not present

## 2022-12-12 NOTE — Progress Notes (Signed)
Remote pacemaker transmission.   

## 2023-01-02 DIAGNOSIS — R1110 Vomiting, unspecified: Secondary | ICD-10-CM | POA: Diagnosis not present

## 2023-01-02 DIAGNOSIS — Z6830 Body mass index (BMI) 30.0-30.9, adult: Secondary | ICD-10-CM | POA: Diagnosis not present

## 2023-01-09 ENCOUNTER — Emergency Department (HOSPITAL_COMMUNITY)
Admission: EM | Admit: 2023-01-09 | Discharge: 2023-01-09 | Disposition: A | Discharge status: Home / Self Care | Payer: Medicare HMO | Attending: Emergency Medicine | Admitting: Emergency Medicine

## 2023-01-09 ENCOUNTER — Other Ambulatory Visit: Payer: Self-pay

## 2023-01-09 ENCOUNTER — Encounter (HOSPITAL_COMMUNITY): Payer: Self-pay | Admitting: Emergency Medicine

## 2023-01-09 DIAGNOSIS — Z993 Dependence on wheelchair: Secondary | ICD-10-CM | POA: Diagnosis not present

## 2023-01-09 DIAGNOSIS — I130 Hypertensive heart and chronic kidney disease with heart failure and stage 1 through stage 4 chronic kidney disease, or unspecified chronic kidney disease: Secondary | ICD-10-CM | POA: Insufficient documentation

## 2023-01-09 DIAGNOSIS — R52 Pain, unspecified: Secondary | ICD-10-CM | POA: Diagnosis not present

## 2023-01-09 DIAGNOSIS — Z79899 Other long term (current) drug therapy: Secondary | ICD-10-CM | POA: Diagnosis not present

## 2023-01-09 DIAGNOSIS — N3000 Acute cystitis without hematuria: Secondary | ICD-10-CM | POA: Insufficient documentation

## 2023-01-09 DIAGNOSIS — N183 Chronic kidney disease, stage 3 unspecified: Secondary | ICD-10-CM | POA: Insufficient documentation

## 2023-01-09 DIAGNOSIS — E1122 Type 2 diabetes mellitus with diabetic chronic kidney disease: Secondary | ICD-10-CM | POA: Diagnosis not present

## 2023-01-09 DIAGNOSIS — R197 Diarrhea, unspecified: Secondary | ICD-10-CM | POA: Insufficient documentation

## 2023-01-09 DIAGNOSIS — R531 Weakness: Secondary | ICD-10-CM | POA: Diagnosis not present

## 2023-01-09 DIAGNOSIS — D62 Acute posthemorrhagic anemia: Secondary | ICD-10-CM | POA: Diagnosis not present

## 2023-01-09 DIAGNOSIS — Z6830 Body mass index (BMI) 30.0-30.9, adult: Secondary | ICD-10-CM | POA: Diagnosis not present

## 2023-01-09 DIAGNOSIS — E875 Hyperkalemia: Secondary | ICD-10-CM | POA: Diagnosis not present

## 2023-01-09 DIAGNOSIS — E039 Hypothyroidism, unspecified: Secondary | ICD-10-CM | POA: Insufficient documentation

## 2023-01-09 DIAGNOSIS — Z03818 Encounter for observation for suspected exposure to other biological agents ruled out: Secondary | ICD-10-CM | POA: Diagnosis not present

## 2023-01-09 DIAGNOSIS — R112 Nausea with vomiting, unspecified: Secondary | ICD-10-CM | POA: Insufficient documentation

## 2023-01-09 DIAGNOSIS — R0602 Shortness of breath: Secondary | ICD-10-CM | POA: Diagnosis not present

## 2023-01-09 DIAGNOSIS — R799 Abnormal finding of blood chemistry, unspecified: Secondary | ICD-10-CM | POA: Diagnosis not present

## 2023-01-09 DIAGNOSIS — I5022 Chronic systolic (congestive) heart failure: Secondary | ICD-10-CM | POA: Diagnosis not present

## 2023-01-09 DIAGNOSIS — N179 Acute kidney failure, unspecified: Secondary | ICD-10-CM | POA: Insufficient documentation

## 2023-01-09 DIAGNOSIS — E860 Dehydration: Secondary | ICD-10-CM | POA: Insufficient documentation

## 2023-01-09 DIAGNOSIS — E1121 Type 2 diabetes mellitus with diabetic nephropathy: Secondary | ICD-10-CM | POA: Diagnosis not present

## 2023-01-09 DIAGNOSIS — R7889 Finding of other specified substances, not normally found in blood: Secondary | ICD-10-CM | POA: Diagnosis not present

## 2023-01-09 LAB — CBC WITH DIFFERENTIAL/PLATELET
Abs Immature Granulocytes: 0.1 10*3/uL — ABNORMAL HIGH (ref 0.00–0.07)
Basophils Absolute: 0 10*3/uL (ref 0.0–0.1)
Basophils Relative: 0 %
Eosinophils Absolute: 0.1 10*3/uL (ref 0.0–0.5)
Eosinophils Relative: 0 %
HCT: 33.7 % — ABNORMAL LOW (ref 36.0–46.0)
Hemoglobin: 11 g/dL — ABNORMAL LOW (ref 12.0–15.0)
Immature Granulocytes: 1 %
Lymphocytes Relative: 8 %
Lymphs Abs: 1.4 10*3/uL (ref 0.7–4.0)
MCH: 31.3 pg (ref 26.0–34.0)
MCHC: 32.6 g/dL (ref 30.0–36.0)
MCV: 96 fL (ref 80.0–100.0)
Monocytes Absolute: 0.8 10*3/uL (ref 0.1–1.0)
Monocytes Relative: 5 %
Neutro Abs: 14.1 10*3/uL — ABNORMAL HIGH (ref 1.7–7.7)
Neutrophils Relative %: 86 %
Platelets: 466 10*3/uL — ABNORMAL HIGH (ref 150–400)
RBC: 3.51 MIL/uL — ABNORMAL LOW (ref 3.87–5.11)
RDW: 13.2 % (ref 11.5–15.5)
WBC: 16.5 10*3/uL — ABNORMAL HIGH (ref 4.0–10.5)
nRBC: 0 % (ref 0.0–0.2)

## 2023-01-09 LAB — URINALYSIS, ROUTINE W REFLEX MICROSCOPIC
Bilirubin Urine: NEGATIVE
Glucose, UA: NEGATIVE mg/dL
Hgb urine dipstick: NEGATIVE
Ketones, ur: NEGATIVE mg/dL
Nitrite: POSITIVE — AB
Protein, ur: NEGATIVE mg/dL
Specific Gravity, Urine: 1.01 (ref 1.005–1.030)
pH: 7 (ref 5.0–8.0)

## 2023-01-09 LAB — COMPREHENSIVE METABOLIC PANEL
ALT: 32 U/L (ref 0–44)
AST: 22 U/L (ref 15–41)
Albumin: 3 g/dL — ABNORMAL LOW (ref 3.5–5.0)
Alkaline Phosphatase: 88 U/L (ref 38–126)
Anion gap: 9 (ref 5–15)
BUN: 44 mg/dL — ABNORMAL HIGH (ref 8–23)
CO2: 25 mmol/L (ref 22–32)
Calcium: 9.3 mg/dL (ref 8.9–10.3)
Chloride: 97 mmol/L — ABNORMAL LOW (ref 98–111)
Creatinine, Ser: 1.66 mg/dL — ABNORMAL HIGH (ref 0.44–1.00)
GFR, Estimated: 31 mL/min — ABNORMAL LOW (ref >60)
Glucose, Bld: 164 mg/dL — ABNORMAL HIGH (ref 70–99)
Potassium: 4.8 mmol/L (ref 3.5–5.1)
Sodium: 131 mmol/L — ABNORMAL LOW (ref 135–145)
Total Bilirubin: 0.6 mg/dL (ref 0.3–1.2)
Total Protein: 7.3 g/dL (ref 6.5–8.1)

## 2023-01-09 LAB — LIPASE, BLOOD: Lipase: 54 U/L — ABNORMAL HIGH (ref 11–51)

## 2023-01-09 MED ORDER — SODIUM CHLORIDE 0.9 % IV BOLUS
1000.0000 mL | Freq: Once | INTRAVENOUS | Status: AC
  Administered 2023-01-09: 1000 mL via INTRAVENOUS

## 2023-01-09 MED ORDER — CEPHALEXIN 500 MG PO CAPS: 500.0000 mg | ORAL_CAPSULE | Freq: Three times a day (TID) | ORAL | 0 refills | Status: DC

## 2023-01-09 MED ORDER — SODIUM CHLORIDE 0.9 % IV SOLN
1.0000 g | Freq: Once | INTRAVENOUS | Status: AC
  Administered 2023-01-09: 1 g via INTRAVENOUS
  Filled 2023-01-09: qty 10

## 2023-01-09 MED ORDER — LACTATED RINGERS IV BOLUS: 1000.0000 mL | Freq: Once | INTRAVENOUS | Status: DC

## 2023-01-09 NOTE — Discharge Instructions (Addendum)
Contact a health care provider if: Your symptoms do not get better after 1-2 days. Your symptoms go away and then return. Get help right away if: You have severe pain in your back or your lower abdomen. You have a fever or chills. You have nausea or vomiting. 

## 2023-01-09 NOTE — ED Triage Notes (Signed)
Pt BIBA with c/o abnormal labs of K+ and kidney fx. Pt states she has been "feeling bad and tired" past 10 days.   BP 132/86 HR 96  CBG 128

## 2023-01-09 NOTE — ED Provider Notes (Signed)
[facility] EMERGENCY DEPARTMENT AT [facility] Provider Note   CSN: [id number] Arrival date & time: 01/09/23  1854     History {Add pertinent medical, surgical, social history, OB history to HPI:1} No chief complaint on file.   Gabriela Phillips is a 82 y.o. female who reports that she was sent in due to bad kidney function.  Patient reports that she has at 10 days of nausea vomiting and diarrhea.  She went to an urgent care last week and was given medication "that helped settle my stomach."  She reports that this improved her nausea however she was still vomiting every time she would eat or drink.  Patient reports that her last episode of vomiting was yesterday but she has had progressive weakness stating that she could not even stand up to brush her teeth.  Her husband woke her up today and told her that she needed to come to the emergency department because her kidney function was "really up." She denies any nausea at this time.  She has been able to pass gas.  She has not had any diarrhea in a couple of days.  HPI     Home Medications Prior to Admission medications   Medication Sig Start Date End Date Taking? Authorizing Provider  acetaminophen (TYLENOL) 500 MG tablet Take 500 mg by mouth every 6 (six) hours as needed for mild pain, fever or headache.    [provider]  acetaminophen (TYLENOL) 650 MG CR tablet Take 1,300 mg by mouth daily.    [provider]  atorvastatin (LIPITOR) 40 MG tablet Take 1 tablet (40 mg total) by mouth daily. 02/18/22   Corky Crafts, MD  Azelastine HCl 137 MCG/SPRAY SOLN Place 1 spray into both nostrils 2 (two) times daily. 08/14/20   [provider]  carvedilol (COREG) 6.25 MG tablet Take 6.25 mg by mouth 2 (two) times daily with a meal.    [provider]  Cranberry 500 MG CAPS Take 500 mg by mouth daily.    [provider]  fenofibrate micronized (LOFIBRA) 67 MG capsule Take 67 mg by mouth  daily before breakfast.    [provider]  levocetirizine (XYZAL) 5 MG tablet Take 5 mg by mouth every evening.    [provider]  levothyroxine (SYNTHROID, LEVOTHROID) 75 MCG tablet Take 75 mcg by mouth daily.    [provider]  losartan (COZAAR) 100 MG tablet Take 100 mg by mouth daily.    [provider]  nitroGLYCERIN (NITROSTAT) 0.4 MG SL tablet Place 1 tablet (0.4 mg total) under the tongue every 5 (five) minutes as needed for chest pain. 05/23/22   Corky Crafts, MD  omeprazole (PRILOSEC OTC) 20 MG tablet Take 20 mg by mouth daily as needed (indigestion).    [provider]  potassium chloride SA (KLOR-CON M) 20 MEQ tablet TAKE 1 TABLET DAILY AS NEEDED. 10/12/21   Corky Crafts, MD  sertraline (ZOLOFT) 50 MG tablet Take 50 mg by mouth daily. 09/16/20   [provider]  Tetrahydrozoline HCl (VISINE OP) Place 1 drop into both eyes daily as needed (redness).    [provider]  torsemide (DEMADEX) 20 MG tablet Take 1 tablet (20 mg total) by mouth daily. 08/04/22   Corky Crafts, MD      Allergies    Crestor [rosuvastatin calcium], Rosuvastatin, Nsaids, and Penicillins    Review of Systems   Review of Systems  Physical Exam Updated Vital  Signs BP (!) 122/50 (BP Location: Left Arm)   Pulse 77   Temp 98.6 F (37 C) (Oral)   Resp 17   SpO2 95%  Physical Exam Vitals and nursing note reviewed.  Constitutional:      General: She is not in acute distress.    Appearance: She is well-developed. She is not diaphoretic.     Comments: Appear generally weak  HENT:     Head: Normocephalic and atraumatic.     Right Ear: External ear normal.     Left Ear: External ear normal.     Nose: Nose normal.     Mouth/Throat:     Mouth: Mucous membranes are dry.  Eyes:     General: No scleral icterus.    Conjunctiva/sclera: Conjunctivae normal.  Cardiovascular:     Rate and Rhythm: Normal rate and regular rhythm.      Heart sounds: Normal heart sounds. No murmur heard.    No friction rub. No gallop.  Pulmonary:     Effort: Pulmonary effort is normal. No respiratory distress.     Breath sounds: Normal breath sounds.  Abdominal:     General: Bowel sounds are normal. There is no distension.     Palpations: Abdomen is soft. There is no mass.     Tenderness: There is no abdominal tenderness. There is no guarding.  Musculoskeletal:     Cervical back: Normal range of motion.  Skin:    General: Skin is warm and dry.  Neurological:     Mental Status: She is alert and oriented to person, place, and time.  Psychiatric:        Behavior: Behavior normal.     ED Results / Procedures / Treatments   Labs (all labs ordered are listed, but only abnormal results are displayed) Labs Reviewed - No data to display  EKG None  Radiology No results found.  Procedures Procedures  {Document cardiac monitor, telemetry assessment procedure when appropriate:1}  Medications Ordered in ED Medications - No data to display  ED Course/ Medical Decision Making/ A&P   {   Click here for ABCD2, HEART and other calculatorsREFRESH Note before signing :1}                          Medical Decision Making  ***  {Document critical care time when appropriate:1} {Document review of labs and clinical decision tools ie heart score, Chads2Vasc2 etc:1}  {Document your independent review of radiology images, and any outside records:1} {Document your discussion with family members, caretakers, and with consultants:1} {Document social determinants of health affecting pt's care:1} {Document your decision making why or why not admission, treatments were needed:1} Final Clinical Impression(s) / ED Diagnoses Final diagnoses:  None    Rx / DC Orders ED Discharge Orders     None

## 2023-01-10 DIAGNOSIS — E860 Dehydration: Secondary | ICD-10-CM | POA: Diagnosis not present

## 2023-01-10 LAB — URINE CULTURE

## 2023-01-11 LAB — URINE CULTURE: Culture: >=100000 — AB

## 2023-01-12 ENCOUNTER — Telehealth (HOSPITAL_BASED_OUTPATIENT_CLINIC_OR_DEPARTMENT_OTHER): Payer: Self-pay

## 2023-01-12 NOTE — Telephone Encounter (Signed)
Post ED Visit - Positive Culture Follow-up  Culture report reviewed by antimicrobial stewardship pharmacist: [facility] []  Enzo Bi, Pharm.D. []  Celedonio Miyamoto, Pharm.D., BCPS AQ-ID []  Garvin Fila, Pharm.D., BCPS [x]  Georgina Pillion, Pharm.D., BCPS []  [place], [address].D., BCPS, AAHIVP []  Estella Husk, Pharm.D., BCPS, AAHIVP []  Lysle Pearl, PharmD, BCPS []  Phillips Climes, PharmD, BCPS []  Agapito Games, PharmD, BCPS []  Verlan Friends, PharmD []  Mervyn Gay, PharmD, BCPS []  Vinnie Level, PharmD  [facility] []  Len Childs, PharmD []  Greer Pickerel, PharmD []  Adalberto Cole, PharmD []  Perlie Gold, Rph []  Lonell Face) Jean Rosenthal, PharmD []  Earl Many, PharmD []  Junita Push, PharmD []  Dorna Leitz, PharmD []  Terrilee Files, PharmD []  Lynann Beaver, PharmD []  Keturah Barre, PharmD []  Loralee Pacas, PharmD []  Bernadene Person, PharmD   Positive urine culture Treated with Cephalexin, organism sensitive to the same and no further patient follow-up is required at this time.  Gabriela Phillips 01/12/2023, 12:20 PM

## 2023-01-16 DIAGNOSIS — R946 Abnormal results of thyroid function studies: Secondary | ICD-10-CM | POA: Diagnosis not present

## 2023-01-16 DIAGNOSIS — R1013 Epigastric pain: Secondary | ICD-10-CM | POA: Diagnosis not present

## 2023-01-16 DIAGNOSIS — R799 Abnormal finding of blood chemistry, unspecified: Secondary | ICD-10-CM | POA: Diagnosis not present

## 2023-01-16 DIAGNOSIS — Z6829 Body mass index (BMI) 29.0-29.9, adult: Secondary | ICD-10-CM | POA: Diagnosis not present

## 2023-01-16 DIAGNOSIS — R5381 Other malaise: Secondary | ICD-10-CM | POA: Diagnosis not present

## 2023-01-17 ENCOUNTER — Encounter: Payer: Self-pay | Admitting: Family Medicine

## 2023-01-17 ENCOUNTER — Other Ambulatory Visit: Payer: Self-pay | Admitting: Family Medicine

## 2023-01-17 DIAGNOSIS — R1013 Epigastric pain: Secondary | ICD-10-CM

## 2023-02-06 DIAGNOSIS — E039 Hypothyroidism, unspecified: Secondary | ICD-10-CM | POA: Diagnosis not present

## 2023-02-06 DIAGNOSIS — F411 Generalized anxiety disorder: Secondary | ICD-10-CM | POA: Diagnosis not present

## 2023-02-06 DIAGNOSIS — Z862 Personal history of diseases of the blood and blood-forming organs and certain disorders involving the immune mechanism: Secondary | ICD-10-CM | POA: Diagnosis not present

## 2023-02-06 DIAGNOSIS — E1122 Type 2 diabetes mellitus with diabetic chronic kidney disease: Secondary | ICD-10-CM | POA: Diagnosis not present

## 2023-02-06 DIAGNOSIS — N1832 Chronic kidney disease, stage 3b: Secondary | ICD-10-CM | POA: Diagnosis not present

## 2023-02-14 ENCOUNTER — Ambulatory Visit (INDEPENDENT_AMBULATORY_CARE_PROVIDER_SITE_OTHER): Payer: Medicare HMO

## 2023-02-14 DIAGNOSIS — I442 Atrioventricular block, complete: Secondary | ICD-10-CM | POA: Diagnosis not present

## 2023-02-14 LAB — CUP PACEART REMOTE DEVICE CHECK
Battery Remaining Longevity: 74 mo
Battery Voltage: 2.98 V
Brady Statistic AP VP Percent: 47.58 %
Brady Statistic AP VS Percent: 0 %
Brady Statistic AS VP Percent: 52.31 %
Brady Statistic AS VS Percent: 0.11 %
Brady Statistic RA Percent Paced: 47.6 %
Brady Statistic RV Percent Paced: 99.89 %
Date Time Interrogation Session: 20240827030650
Implantable Lead Connection Status: 753985
Implantable Lead Connection Status: 753985
Implantable Lead Implant Date: 20130107
Implantable Lead Implant Date: 20130107
Implantable Lead Location: 753859
Implantable Lead Location: 753860
Implantable Lead Model: 5076
Implantable Lead Model: 5092
Implantable Pulse Generator Implant Date: 20220502
Lead Channel Impedance Value: 380 Ohm
Lead Channel Impedance Value: 418 Ohm
Lead Channel Impedance Value: 456 Ohm
Lead Channel Impedance Value: 475 Ohm
Lead Channel Pacing Threshold Amplitude: 1.5 V
Lead Channel Pacing Threshold Amplitude: 1.625 V
Lead Channel Pacing Threshold Pulse Width: 0.4 ms
Lead Channel Pacing Threshold Pulse Width: 0.4 ms
Lead Channel Sensing Intrinsic Amplitude: 1.875 mV
Lead Channel Sensing Intrinsic Amplitude: 1.875 mV
Lead Channel Sensing Intrinsic Amplitude: 3.625 mV
Lead Channel Setting Pacing Amplitude: 3.25 V
Lead Channel Setting Pacing Amplitude: 3.25 V
Lead Channel Setting Pacing Pulse Width: 0.4 ms
Lead Channel Setting Sensing Sensitivity: 0.9 mV
Zone Setting Status: 755011

## 2023-02-24 ENCOUNTER — Ambulatory Visit
Admission: RE | Admit: 2023-02-24 | Discharge: 2023-02-24 | Disposition: A | Discharge status: Home / Self Care | Payer: Medicare HMO | Source: Ambulatory Visit | Attending: Family Medicine | Admitting: Family Medicine

## 2023-02-24 DIAGNOSIS — R1013 Epigastric pain: Secondary | ICD-10-CM

## 2023-02-24 MED ORDER — IOPAMIDOL (ISOVUE-300) INJECTION 61%
500.0000 mL | Freq: Once | INTRAVENOUS | Status: AC | PRN
  Administered 2023-02-24: 80 mL via INTRAVENOUS

## 2023-02-27 NOTE — Progress Notes (Signed)
Remote pacemaker transmission.   

## 2023-03-03 ENCOUNTER — Encounter: Payer: Self-pay | Admitting: Internal Medicine

## 2023-03-03 ENCOUNTER — Ambulatory Visit: Payer: Medicare HMO | Attending: Internal Medicine | Admitting: Internal Medicine

## 2023-03-03 VITALS — BP 124/64 | HR 90 | Ht 60.0 in | Wt 146.8 lb

## 2023-03-03 DIAGNOSIS — Z950 Presence of cardiac pacemaker: Secondary | ICD-10-CM | POA: Diagnosis not present

## 2023-03-03 DIAGNOSIS — Z45018 Encounter for adjustment and management of other part of cardiac pacemaker: Secondary | ICD-10-CM | POA: Diagnosis not present

## 2023-03-03 DIAGNOSIS — R0602 Shortness of breath: Secondary | ICD-10-CM | POA: Diagnosis not present

## 2023-03-03 LAB — CUP PACEART INCLINIC DEVICE CHECK
Date Time Interrogation Session: 20240913193343
Implantable Lead Connection Status: 753985
Implantable Lead Connection Status: 753985
Implantable Lead Implant Date: 20130107
Implantable Lead Implant Date: 20130107
Implantable Lead Location: 753859
Implantable Lead Location: 753860
Implantable Lead Model: 5076
Implantable Lead Model: 5092
Implantable Pulse Generator Implant Date: 20220502

## 2023-03-03 NOTE — Progress Notes (Signed)
HPI Mrs. Dudeck returns today for followup. She is a pleasant 82 yo woman with CHB, s/p PPM insertion. She underwent PM gen change out a couple of months ago. She has felt well and denies chest pain or sob. Minimal edema. She has had problems with her cholesterol. She denies syncope. She is frustrated by her inability to lose weight.  Allergies  Allergen Reactions   Crestor [Rosuvastatin Calcium] Shortness Of Breath    CHEST TIGHTNESS   Rosuvastatin Shortness Of Breath    Other reaction(s): chest tightness,    Nsaids     History of bleeding ulcers   Penicillins Other (See Comments)    Dark stool     Current Outpatient Medications  Medication Sig Dispense Refill   acetaminophen (TYLENOL) 500 MG tablet Take 500 mg by mouth every 6 (six) hours as needed for mild pain, fever or headache.     acetaminophen (TYLENOL) 650 MG CR tablet Take 1,300 mg by mouth daily.     atorvastatin (LIPITOR) 40 MG tablet Take 1 tablet (40 mg total) by mouth daily. 90 tablet 3   Azelastine HCl 137 MCG/SPRAY SOLN Place 1 spray into both nostrils 2 (two) times daily.     carvedilol (COREG) 6.25 MG tablet Take 6.25 mg by mouth 2 (two) times daily with a meal.     cephALEXin (KEFLEX) 500 MG capsule Take 1 capsule (500 mg total) by mouth 3 (three) times daily. 21 capsule 0   Cranberry 500 MG CAPS Take 500 mg by mouth daily.     fenofibrate micronized (LOFIBRA) 67 MG capsule Take 67 mg by mouth daily before breakfast.     levocetirizine (XYZAL) 5 MG tablet Take 5 mg by mouth every evening.     levothyroxine (SYNTHROID, LEVOTHROID) 75 MCG tablet Take 75 mcg by mouth daily.     losartan (COZAAR) 100 MG tablet Take 100 mg by mouth daily.     nitroGLYCERIN (NITROSTAT) 0.4 MG SL tablet Place 1 tablet (0.4 mg total) under the tongue every 5 (five) minutes as needed for chest pain. 90 tablet 0   omeprazole (PRILOSEC OTC) 20 MG tablet Take 20 mg by mouth daily as needed (indigestion).     potassium chloride SA  (KLOR-CON M) 20 MEQ tablet TAKE 1 TABLET DAILY AS NEEDED. 90 tablet 0   sertraline (ZOLOFT) 50 MG tablet Take 50 mg by mouth daily.     Tetrahydrozoline HCl (VISINE OP) Place 1 drop into both eyes daily as needed (redness).     torsemide (DEMADEX) 20 MG tablet Take 1 tablet (20 mg total) by mouth daily. 90 tablet 2   No current facility-administered medications for this visit.     Past Medical History:  Diagnosis Date   Anemia 2016   CAD (coronary artery disease)    a. LHC (2/28): Severe HK at the apex, EF 40%, diffuse luminal irregularities, no significant CAD.;  b.  Lexiscan Myoview (2/13): EF 76%, normal perfusion; low-risk study;  c.  Lexiscan Myoview (10/14):  Low risk, no ischemia, EF 75%.   Chronic systolic CHF (congestive heart failure) (HCC)    EF previously 40%; NICM; EF recovered;  Echo (03/2010): Normal LV function, EF 68.4%, apical AK, mild MR, mild TR, normal RV systolic pressure, diastolic dysfunction.   CKD (chronic kidney disease), stage III (HCC)    Complete heart block (HCC)    Diabetes mellitus without complication (HCC)    Ejection fraction < 50%    68%  Fatty liver    Gastritis    Hypertension    Hypothyroidism    Mild mitral regurgitation    NICM (nonischemic cardiomyopathy) (HCC)    non-ischemic; resolved EF 75%; GSPECT   Pacemaker 2005   Medtronic [id number] for third-degree AV block   Pre-diabetes    Radiculopathy    followed by Dr. Terrace Arabia   Syncope     ROS:   All systems reviewed and negative except as noted in the HPI.   Past Surgical History:  Procedure Laterality Date   BREAST ENHANCEMENT SURGERY     CATARACT EXTRACTION     DOPPLER ECHOCARDIOGRAPHY  03/2010   with normal left ventricular function, ejection fraction 68%, apical akinesis and mild mitral regurgitation   ESOPHAGOGASTRODUODENOSCOPY     INTRAOCULAR LENS INSERTION     PACEMAKER GENERATOR CHANGE N/A 06/27/2011   Procedure: PACEMAKER GENERATOR CHANGE;  Surgeon: Marinus Maw, MD;   Location: [facility] CATH LAB;  Service: Cardiovascular;  Laterality: N/A;   PACEMAKER INSERTION  2005   complete heart block   PPM GENERATOR CHANGEOUT N/A 10/19/2020   Procedure: PPM GENERATOR CHANGEOUT;  Surgeon: Marinus Maw, MD;  Location: [facility] INVASIVE CV LAB;  Service: Cardiovascular;  Laterality: N/A;   ROOT CANAL     TOTAL ABDOMINAL HYSTERECTOMY W/ BILATERAL SALPINGOOPHORECTOMY  1989   X2   UPPER GI ENDOSCOPY       Family History  Problem Relation Age of Onset   Diabetes Mother    Heart Problems Mother    Anemia Mother    Liver cancer Father    Diabetes Father    Alcoholism Father    Diabetes Sister    Heart attack Neg Hx    Stroke Neg Hx    Hypertension Neg Hx      Social History   Socioeconomic History   Marital status: Married    Spouse name: Not on file   Number of children: Not on file   Years of education: Not on file   Highest education level: Not on file  Occupational History   Not on file  Tobacco Use   Smoking status: Former    Current packs/day: 0.00    Types: Cigarettes    Quit date: 06/23/1997    Years since quitting: 25.7   Smokeless tobacco: Never  Substance and Sexual Activity   Alcohol use: No   Drug use: No   Sexual activity: Not on file  Other Topics Concern   Not on file  Social History Narrative   Not on file   Social Determinants of Health   Financial Resource Strain: Not on file  Food Insecurity: Not on file  Transportation Needs: Not on file  Physical Activity: Not on file  Stress: Not on file  Social Connections: Not on file  Intimate Partner Violence: Not on file     BP 124/64   Pulse 90   Ht 5' (1.524 m)   Wt 146 lb 12.8 oz (66.6 kg)   SpO2 98%   BMI 28.67 kg/m   Physical Exam:  Well appearing NAD HEENT: Unremarkable Neck:  No JVD, no thyromegally Lymphatics:  No adenopathy Back:  No CVA tenderness Lungs:  Clear HEART:  Regular rate rhythm, no murmurs, no rubs, no clicks Abd:  soft, positive bowel sounds, no  organomegally, no rebound, no guarding Ext:  2 plus pulses, no edema, no cyanosis, no clubbing Skin:  No rashes no nodules Neuro:  CN II through XII intact, motor grossly intact  EKG  DEVICE  Normal device function.  See PaceArt for details.   Assess/Plan:   CHB - she is s/p PPM insertion and doing well. No escape today. Her outputs adjusted to improve battery longevity.  HTN - her bp is well controlled.  CAD - she denies anginal symptoms.She does have some dyspnea and I have asked her to obtain a 2D echo to rule out pacing induced LV dysfunction. PPM -her medtronic DDD PM is working normally.  Sharlot Gowda Bethanne Mule,MD

## 2023-03-03 NOTE — Patient Instructions (Signed)
Medication Instructions:  Your physician recommends that you continue on your current medications as directed. Please refer to the Current Medication list given to you today.  *If you need a refill on your cardiac medications before your next appointment, please call your pharmacy*  Lab Work: None ordered.  If you have labs (blood work) drawn today and your tests are completely normal, you will receive your results only by: MyChart Message (if you have MyChart) OR A paper copy in the mail If you have any lab test that is abnormal or we need to change your treatment, we will call you to review the results.  Testing/Procedures: Your physician has requested that you have an echocardiogram. Echocardiography is a painless test that uses sound waves to create images of your heart. It provides your doctor with information about the size and shape of your heart and how well your heart's chambers and valves are working. This procedure takes approximately one hour. There are no restrictions for this procedure. Please do NOT wear cologne, perfume, aftershave, or lotions (deodorant is allowed). Please arrive 15 minutes prior to your appointment time.   Follow-Up: At [facility], you and your health needs are our priority.  As part of our continuing mission to provide you with exceptional heart care, we have created designated Provider Care Teams.  These Care Teams include your primary Cardiologist (physician) and Advanced Practice Providers (APPs -  Physician Assistants and Nurse Practitioners) who all work together to provide you with the care you need, when you need it.  Your next appointment:   6 months  The format for your next appointment:   In Person  Provider:   Dr Donato Schultz  Important Information About Sugar

## 2023-03-27 ENCOUNTER — Ambulatory Visit (HOSPITAL_COMMUNITY): Payer: Medicare HMO | Attending: Internal Medicine

## 2023-03-27 DIAGNOSIS — R0602 Shortness of breath: Secondary | ICD-10-CM | POA: Insufficient documentation

## 2023-03-27 LAB — ECHOCARDIOGRAM COMPLETE
Area-P 1/2: 3.99 cm2
MV M vel: 4.46 m/s
MV Peak grad: 79.6 mmHg
P 1/2 time: 542 ms
Radius: 0.7 cm
S' Lateral: 3 cm

## 2023-04-03 ENCOUNTER — Ambulatory Visit: Payer: Medicare HMO | Admitting: Interventional Cardiology

## 2023-04-19 DIAGNOSIS — E039 Hypothyroidism, unspecified: Secondary | ICD-10-CM | POA: Diagnosis not present

## 2023-04-19 DIAGNOSIS — N1832 Chronic kidney disease, stage 3b: Secondary | ICD-10-CM | POA: Diagnosis not present

## 2023-05-10 ENCOUNTER — Other Ambulatory Visit: Payer: Self-pay | Admitting: Interventional Cardiology

## 2023-05-12 DIAGNOSIS — Z1231 Encounter for screening mammogram for malignant neoplasm of breast: Secondary | ICD-10-CM | POA: Diagnosis not present

## 2023-05-16 ENCOUNTER — Ambulatory Visit (INDEPENDENT_AMBULATORY_CARE_PROVIDER_SITE_OTHER): Payer: Medicare HMO

## 2023-05-16 DIAGNOSIS — I442 Atrioventricular block, complete: Secondary | ICD-10-CM | POA: Diagnosis not present

## 2023-05-16 LAB — CUP PACEART REMOTE DEVICE CHECK
Battery Remaining Longevity: 75 mo
Battery Voltage: 2.98 V
Brady Statistic AP VP Percent: 62.63 %
Brady Statistic AP VS Percent: 0 %
Brady Statistic AS VP Percent: 37.26 %
Brady Statistic AS VS Percent: 0.11 %
Brady Statistic RA Percent Paced: 62.62 %
Brady Statistic RV Percent Paced: 99.89 %
Date Time Interrogation Session: 20241126033315
Implantable Lead Connection Status: 753985
Implantable Lead Connection Status: 753985
Implantable Lead Implant Date: 20130107
Implantable Lead Implant Date: 20130107
Implantable Lead Location: 753859
Implantable Lead Location: 753860
Implantable Lead Model: 5076
Implantable Lead Model: 5092
Implantable Pulse Generator Implant Date: 20220502
Lead Channel Impedance Value: 361 Ohm
Lead Channel Impedance Value: 399 Ohm
Lead Channel Impedance Value: 399 Ohm
Lead Channel Impedance Value: 418 Ohm
Lead Channel Pacing Threshold Amplitude: 1.5 V
Lead Channel Pacing Threshold Amplitude: 1.625 V
Lead Channel Pacing Threshold Pulse Width: 0.4 ms
Lead Channel Pacing Threshold Pulse Width: 0.4 ms
Lead Channel Sensing Intrinsic Amplitude: 1.75 mV
Lead Channel Sensing Intrinsic Amplitude: 1.75 mV
Lead Channel Sensing Intrinsic Amplitude: 3.625 mV
Lead Channel Setting Pacing Amplitude: 2.5 V
Lead Channel Setting Pacing Amplitude: 2.5 V
Lead Channel Setting Pacing Pulse Width: 0.6 ms
Lead Channel Setting Sensing Sensitivity: 0.9 mV
Zone Setting Status: 755011

## 2023-06-16 NOTE — Progress Notes (Signed)
Remote pacemaker transmission.   

## 2023-08-07 DIAGNOSIS — E782 Mixed hyperlipidemia: Secondary | ICD-10-CM | POA: Diagnosis not present

## 2023-08-07 DIAGNOSIS — I10 Essential (primary) hypertension: Secondary | ICD-10-CM | POA: Diagnosis not present

## 2023-08-07 DIAGNOSIS — D62 Acute posthemorrhagic anemia: Secondary | ICD-10-CM | POA: Diagnosis not present

## 2023-08-07 DIAGNOSIS — E1121 Type 2 diabetes mellitus with diabetic nephropathy: Secondary | ICD-10-CM | POA: Diagnosis not present

## 2023-08-07 DIAGNOSIS — E039 Hypothyroidism, unspecified: Secondary | ICD-10-CM | POA: Diagnosis not present

## 2023-08-14 DIAGNOSIS — E782 Mixed hyperlipidemia: Secondary | ICD-10-CM | POA: Diagnosis not present

## 2023-08-14 DIAGNOSIS — I10 Essential (primary) hypertension: Secondary | ICD-10-CM | POA: Diagnosis not present

## 2023-08-14 DIAGNOSIS — E039 Hypothyroidism, unspecified: Secondary | ICD-10-CM | POA: Diagnosis not present

## 2023-08-14 DIAGNOSIS — Z0000 Encounter for general adult medical examination without abnormal findings: Secondary | ICD-10-CM | POA: Diagnosis not present

## 2023-08-14 DIAGNOSIS — J309 Allergic rhinitis, unspecified: Secondary | ICD-10-CM | POA: Diagnosis not present

## 2023-08-14 DIAGNOSIS — N1832 Chronic kidney disease, stage 3b: Secondary | ICD-10-CM | POA: Diagnosis not present

## 2023-08-14 DIAGNOSIS — D473 Essential (hemorrhagic) thrombocythemia: Secondary | ICD-10-CM | POA: Diagnosis not present

## 2023-08-14 DIAGNOSIS — I5032 Chronic diastolic (congestive) heart failure: Secondary | ICD-10-CM | POA: Diagnosis not present

## 2023-08-14 DIAGNOSIS — E1121 Type 2 diabetes mellitus with diabetic nephropathy: Secondary | ICD-10-CM | POA: Diagnosis not present

## 2023-08-15 ENCOUNTER — Ambulatory Visit (INDEPENDENT_AMBULATORY_CARE_PROVIDER_SITE_OTHER): Payer: Medicare HMO

## 2023-08-15 DIAGNOSIS — I442 Atrioventricular block, complete: Secondary | ICD-10-CM

## 2023-08-16 LAB — CUP PACEART REMOTE DEVICE CHECK
Battery Remaining Longevity: 72 mo
Battery Voltage: 2.97 V
Brady Statistic AP VP Percent: 58.79 %
Brady Statistic AP VS Percent: 0 %
Brady Statistic AS VP Percent: 41.08 %
Brady Statistic AS VS Percent: 0.14 %
Brady Statistic RA Percent Paced: 58.79 %
Brady Statistic RV Percent Paced: 99.86 %
Date Time Interrogation Session: 20250225004346
Implantable Lead Connection Status: 753985
Implantable Lead Connection Status: 753985
Implantable Lead Implant Date: 20130107
Implantable Lead Implant Date: 20130107
Implantable Lead Location: 753859
Implantable Lead Location: 753860
Implantable Lead Model: 5076
Implantable Lead Model: 5092
Implantable Pulse Generator Implant Date: 20220502
Lead Channel Impedance Value: 380 Ohm
Lead Channel Impedance Value: 399 Ohm
Lead Channel Impedance Value: 418 Ohm
Lead Channel Impedance Value: 418 Ohm
Lead Channel Pacing Threshold Amplitude: 1.5 V
Lead Channel Pacing Threshold Amplitude: 1.625 V
Lead Channel Pacing Threshold Pulse Width: 0.4 ms
Lead Channel Pacing Threshold Pulse Width: 0.4 ms
Lead Channel Sensing Intrinsic Amplitude: 2 mV
Lead Channel Sensing Intrinsic Amplitude: 2 mV
Lead Channel Sensing Intrinsic Amplitude: 3.625 mV
Lead Channel Setting Pacing Amplitude: 2.5 V
Lead Channel Setting Pacing Amplitude: 2.5 V
Lead Channel Setting Pacing Pulse Width: 0.6 ms
Lead Channel Setting Sensing Sensitivity: 0.9 mV
Zone Setting Status: 755011

## 2023-08-20 ENCOUNTER — Encounter: Payer: Self-pay | Admitting: Internal Medicine

## 2023-09-04 ENCOUNTER — Encounter: Payer: Self-pay | Admitting: Cardiology

## 2023-09-04 ENCOUNTER — Ambulatory Visit: Payer: Medicare HMO | Attending: Cardiology | Admitting: Cardiology

## 2023-09-04 VITALS — BP 112/66 | HR 79 | Ht 60.0 in | Wt 147.6 lb

## 2023-09-04 DIAGNOSIS — Z950 Presence of cardiac pacemaker: Secondary | ICD-10-CM | POA: Diagnosis not present

## 2023-09-04 DIAGNOSIS — I442 Atrioventricular block, complete: Secondary | ICD-10-CM | POA: Diagnosis not present

## 2023-09-04 DIAGNOSIS — I5032 Chronic diastolic (congestive) heart failure: Secondary | ICD-10-CM | POA: Diagnosis not present

## 2023-09-04 DIAGNOSIS — R0602 Shortness of breath: Secondary | ICD-10-CM | POA: Diagnosis not present

## 2023-09-04 DIAGNOSIS — E782 Mixed hyperlipidemia: Secondary | ICD-10-CM | POA: Diagnosis not present

## 2023-09-04 NOTE — Progress Notes (Signed)
 Cardiology Office Note:  .   Date:  09/04/2023  ID:  Gabriela Phillips, DOB March 15, 1941, MRN [id number] PCP: Daisy Floro, MD  [facility] Providers Cardiologist:  Donato Schultz, MD    History of Present Illness: .   Gabriela Phillips is a 83 y.o. female Discussed the use of AI scribe software for clinical note transcription with the patient, who gave verbal consent to proceed.  History of Present Illness Gabriela Phillips is an 83 year old female with coronary artery disease who presents for follow-up. She was referred by Dr. Ladona Ridgel for follow-up of her coronary artery disease.  She has a history of coronary artery disease with a calcium score of 1068, placing her in the 97th percentile. Severe stenosis is present in the proximal D2 and moderate stenosis in the LAD. Mild stenosis is noted in the circumflex, RCA, and PDA, with mild involvement of the left main. An echocardiogram on March 27, 2023, showed a normal ejection fraction of 60-65%, mild left ventricular hypertrophy, mild dilation of the left atrium, a very small pericardial effusion, and mild to moderate mitral and tricuspid valve regurgitation, with no change from the prior study. Her current medications include atorvastatin 40 mg daily and fenofibrate 67 mg for cholesterol and triglyceride management, respectively. Her LDL was 58 as of August 07, 2023.  She has had congestive heart failure for 17 years and experiences episodes but is currently stable. She is on carvedilol 6.25 mg twice daily and losartan 100 mg daily for blood pressure management and kidney protection. Torsemide 20 mg daily is used for fluid management, effectively reducing edema without the need for additional potassium supplementation.  She has a pacemaker with a battery change in June 2022, which is functioning well under the care of Dr. Ladona Ridgel, with annual monitoring.  She has stage 3A chronic kidney disease with a creatinine level of 1.6, indicating  slightly reduced kidney function. Her potassium level was 5.0, at the upper edge of normal.  Her hemoglobin A1c is 6.7, slightly above the diabetes threshold. She manages her condition with diet and lifestyle modifications.  She reports good sleep and takes lorazepam occasionally to aid with rest. She enjoys cooking, gardening, and shopping, although she is not able to engage in long walks. She maintains a good diet to manage her diabetes and cholesterol.       Studies Reviewed: .        Results LABS LDL: 58 (08/07/2023) HbA1c: 6.7 (08/07/2023) ALT: 26 (08/07/2023) Creatinine: 1.6 (08/07/2023) Potassium: 5.0 (08/07/2023)  RADIOLOGY Coronary CT: Calcium score 1068 (97th percentile), severe stenosis in proximal D2, moderate stenosis in LAD, mild stenosis in circumflex, RCA, PDA, and left main (11/2021)  DIAGNOSTIC Echocardiogram: Ejection fraction 60-65%, mild LVH, mildly dilated left atrium, very small pericardial effusion, mild to moderate mitral valve regurgitation, mild to moderate tricuspid valve regurgitation, no change from prior study (03/27/2023) Risk Assessment/Calculations:            Physical Exam:   VS:  BP 112/66   Pulse 79   Ht 5' (1.524 m)   Wt 147 lb 9.6 oz (67 kg)   SpO2 95%   BMI 28.83 kg/m    Wt Readings from Last 3 Encounters:  09/04/23 147 lb 9.6 oz (67 kg)  03/03/23 146 lb 12.8 oz (66.6 kg)  04/01/22 152 lb (68.9 kg)    GEN: Well nourished, well developed in no acute distress NECK: No JVD; No carotid bruits CARDIAC: RRR, no murmurs,  no rubs, no gallops RESPIRATORY:  Clear to auscultation without rales, wheezing or rhonchi  ABDOMEN: Soft, non-tender, non-distended EXTREMITIES:  No edema; No deformity   ASSESSMENT AND PLAN: .    Assessment and Plan Assessment & Plan Coronary Artery Disease Coronary artery disease with a coronary CT in June 2023 showing a calcium score of 1068, in the 97th percentile. Severe stenosis in the proximal D2 and  moderate stenosis in the LAD. Mild stenosis in the circumflex, RCA, and PDA, with no significant stenosis in the left main. Atorvastatin 40 mg is effective in managing cholesterol levels, with an LDL of 58 as of February 2025, and reducing plaque progression. - Continue atorvastatin 40 mg daily. - Follow up in one year with an APP and in two years with the cardiologist.  Congestive Heart Failure Congestive heart failure for 17 years, well-managed with carvedilol 6.25 mg twice daily and torsemide 20 mg daily. Torsemide effectively reduces edema without requiring additional potassium supplementation. - Continue carvedilol 6.25 mg twice daily. - Continue torsemide 20 mg daily.  Hyperlipidemia Hyperlipidemia managed with atorvastatin and fenofibrate. LDL is well-controlled at 58, and triglycerides are managed with fenofibrate, maintaining lipid levels within target range. - Continue atorvastatin 40 mg daily. - Continue fenofibrate 67 mg daily.  Chronic Kidney Disease Stage 3A Chronic kidney disease, stage 3A, with a creatinine level of 1.6. Losartan 100 mg daily is prescribed to protect kidney function, maintaining stable kidney function. - Continue losartan 100 mg daily. - Monitor kidney function periodically.  Diabetes Mellitus Diabetes mellitus with hemoglobin A1c at 6.7. Managed with diet and fenofibrate 67 mg to lower triglycerides, maintaining stable glucose levels. - Continue fenofibrate 67 mg daily. - Monitor hemoglobin A1c levels.  Pacemaker Management Pacemaker in place with the last battery change in June 2022. The device is functioning well under the care of Doctor Ladona Ridgel, with regular follow-ups ensuring optimal performance. - Continue annual follow-up with Doctor Ladona Ridgel for pacemaker management.           Signed, Donato Schultz, MD

## 2023-09-04 NOTE — Patient Instructions (Signed)
 Medication Instructions:  The current medical regimen is effective;  continue present plan and medications.  *If you need a refill on your cardiac medications before your next appointment, please call your pharmacy*  Follow-Up: At [facility], you and your health needs are our priority.  As part of our continuing mission to provide you with exceptional heart care, we have created designated Provider Care Teams.  These Care Teams include your primary Cardiologist (physician) and Advanced Practice Providers (APPs -  Physician Assistants and Nurse Practitioners) who all work together to provide you with the care you need, when you need it.  We recommend signing up for the patient portal called "MyChart".  Sign up information is provided on this After Visit Summary.  MyChart is used to connect with patients for Virtual Visits (Telemedicine).  Patients are able to view lab/test results, encounter notes, upcoming appointments, etc.  Non-urgent messages can be sent to your provider as well.   To learn more about what you can do with MyChart, go to [email or web address].    Your next appointment:   1 year(s)  Provider:   Jari Favre, PA-C, Robin Searing, NP, Eligha Bridegroom, NP, Tereso Newcomer, PA-C, or Perlie Gold, PA-C     Then, Donato Schultz, MD will plan to see you again in 2 year(s).

## 2023-09-20 NOTE — Progress Notes (Signed)
 Remote pacemaker transmission.

## 2023-09-20 NOTE — Addendum Note (Signed)
 Addended by: Elease Etienne A on: 09/20/2023 03:51 PM   Modules accepted: Orders

## 2023-10-23 DIAGNOSIS — E119 Type 2 diabetes mellitus without complications: Secondary | ICD-10-CM | POA: Diagnosis not present

## 2023-10-23 DIAGNOSIS — H52203 Unspecified astigmatism, bilateral: Secondary | ICD-10-CM | POA: Diagnosis not present

## 2023-10-23 DIAGNOSIS — Z961 Presence of intraocular lens: Secondary | ICD-10-CM | POA: Diagnosis not present

## 2023-11-14 ENCOUNTER — Ambulatory Visit (INDEPENDENT_AMBULATORY_CARE_PROVIDER_SITE_OTHER): Payer: Medicare HMO

## 2023-11-14 DIAGNOSIS — I442 Atrioventricular block, complete: Secondary | ICD-10-CM

## 2023-11-20 LAB — CUP PACEART REMOTE DEVICE CHECK
Battery Remaining Longevity: 68 mo
Battery Voltage: 2.97 V
Brady Statistic AP VP Percent: 68.63 %
Brady Statistic AP VS Percent: 0 %
Brady Statistic AS VP Percent: 31.29 %
Brady Statistic AS VS Percent: 0.08 %
Brady Statistic RA Percent Paced: 68.61 %
Brady Statistic RV Percent Paced: 99.91 %
Date Time Interrogation Session: 20250526222138
Implantable Lead Connection Status: 753985
Implantable Lead Connection Status: 753985
Implantable Lead Implant Date: 20130107
Implantable Lead Implant Date: 20130107
Implantable Lead Location: 753859
Implantable Lead Location: 753860
Implantable Lead Model: 5076
Implantable Lead Model: 5092
Implantable Pulse Generator Implant Date: 20220502
Lead Channel Impedance Value: 361 Ohm
Lead Channel Impedance Value: 418 Ohm
Lead Channel Impedance Value: 437 Ohm
Lead Channel Impedance Value: 456 Ohm
Lead Channel Pacing Threshold Amplitude: 1.5 V
Lead Channel Pacing Threshold Amplitude: 1.625 V
Lead Channel Pacing Threshold Pulse Width: 0.4 ms
Lead Channel Pacing Threshold Pulse Width: 0.4 ms
Lead Channel Sensing Intrinsic Amplitude: 1.75 mV
Lead Channel Sensing Intrinsic Amplitude: 1.75 mV
Lead Channel Sensing Intrinsic Amplitude: 3.625 mV
Lead Channel Setting Pacing Amplitude: 2.5 V
Lead Channel Setting Pacing Amplitude: 2.5 V
Lead Channel Setting Pacing Pulse Width: 0.6 ms
Lead Channel Setting Sensing Sensitivity: 0.9 mV
Zone Setting Status: 755011

## 2023-11-23 ENCOUNTER — Ambulatory Visit: Payer: Self-pay | Admitting: Internal Medicine

## 2023-12-04 DIAGNOSIS — J0100 Acute maxillary sinusitis, unspecified: Secondary | ICD-10-CM | POA: Diagnosis not present

## 2023-12-04 DIAGNOSIS — H1033 Unspecified acute conjunctivitis, bilateral: Secondary | ICD-10-CM | POA: Diagnosis not present

## 2023-12-11 DIAGNOSIS — J321 Chronic frontal sinusitis: Secondary | ICD-10-CM | POA: Diagnosis not present

## 2023-12-11 DIAGNOSIS — J0100 Acute maxillary sinusitis, unspecified: Secondary | ICD-10-CM | POA: Diagnosis not present

## 2024-01-04 NOTE — Addendum Note (Signed)
 Addended by: TAWNI DRILLING D on: 01/04/2024 11:33 AM   Modules accepted: Orders

## 2024-01-04 NOTE — Progress Notes (Signed)
 Remote pacemaker transmission.

## 2024-02-07 DIAGNOSIS — J0101 Acute recurrent maxillary sinusitis: Secondary | ICD-10-CM | POA: Diagnosis not present

## 2024-02-13 ENCOUNTER — Ambulatory Visit (INDEPENDENT_AMBULATORY_CARE_PROVIDER_SITE_OTHER): Payer: Medicare HMO

## 2024-02-13 DIAGNOSIS — I442 Atrioventricular block, complete: Secondary | ICD-10-CM

## 2024-02-14 ENCOUNTER — Ambulatory Visit: Payer: Self-pay | Admitting: Internal Medicine

## 2024-02-14 LAB — CUP PACEART REMOTE DEVICE CHECK
Battery Remaining Longevity: 63 mo
Battery Voltage: 2.96 V
Brady Statistic AP VP Percent: 50.13 %
Brady Statistic AP VS Percent: 0.01 %
Brady Statistic AS VP Percent: 49.61 %
Brady Statistic AS VS Percent: 0.25 %
Brady Statistic RA Percent Paced: 50.25 %
Brady Statistic RV Percent Paced: 99.74 %
Date Time Interrogation Session: 20250826034358
Implantable Lead Connection Status: 753985
Implantable Lead Connection Status: 753985
Implantable Lead Implant Date: 20130107
Implantable Lead Implant Date: 20130107
Implantable Lead Location: 753859
Implantable Lead Location: 753860
Implantable Lead Model: 5076
Implantable Lead Model: 5092
Implantable Pulse Generator Implant Date: 20220502
Lead Channel Impedance Value: 361 Ohm
Lead Channel Impedance Value: 399 Ohm
Lead Channel Impedance Value: 399 Ohm
Lead Channel Impedance Value: 418 Ohm
Lead Channel Pacing Threshold Amplitude: 1.5 V
Lead Channel Pacing Threshold Amplitude: 1.625 V
Lead Channel Pacing Threshold Pulse Width: 0.4 ms
Lead Channel Pacing Threshold Pulse Width: 0.4 ms
Lead Channel Sensing Intrinsic Amplitude: 1.875 mV
Lead Channel Sensing Intrinsic Amplitude: 1.875 mV
Lead Channel Sensing Intrinsic Amplitude: 3.625 mV
Lead Channel Setting Pacing Amplitude: 2.5 V
Lead Channel Setting Pacing Amplitude: 2.5 V
Lead Channel Setting Pacing Pulse Width: 0.6 ms
Lead Channel Setting Sensing Sensitivity: 0.9 mV
Zone Setting Status: 755011

## 2024-02-22 DIAGNOSIS — N1832 Chronic kidney disease, stage 3b: Secondary | ICD-10-CM | POA: Diagnosis not present

## 2024-02-22 DIAGNOSIS — E1121 Type 2 diabetes mellitus with diabetic nephropathy: Secondary | ICD-10-CM | POA: Diagnosis not present

## 2024-02-22 DIAGNOSIS — Z23 Encounter for immunization: Secondary | ICD-10-CM | POA: Diagnosis not present

## 2024-03-05 NOTE — Progress Notes (Signed)
 Remote PPM Transmission

## 2024-04-21 NOTE — Progress Notes (Unsigned)
  Electrophysiology Office Note:   Date:  04/22/2024  ID:  Gabriela Phillips, DOB Oct 30, 1940, MRN [id number]  Primary Cardiologist: Oneil Parchment, MD Primary Heart Failure: None Electrophysiologist: Danelle Birmingham, MD       History of Present Illness:   Gabriela Phillips is a 83 y.o. female with h/o CHB s/p PPM, HFrEF, HTN, CAD, CKD III, DM II, hypothyroidism seen today for routine electrophysiology followup.   Since last being seen in our clinic the patient reports doing well overall. She denies concerns with her PPM. Her husband accompanies her to the clinic visit.    She denies chest pain, palpitations, dyspnea, PND, orthopnea, nausea, vomiting, dizziness, syncope, edema, weight gain, or early satiety.   Review of systems complete and found to be negative unless listed in HPI.   EP Information / Studies Reviewed:    EKG is ordered today. Personal review as below.  EKG Interpretation Date/Time:  Monday April 22 2024 11:15:12 EST Ventricular Rate:  77 PR Interval:  178 QRS Duration:  148 QT Interval:  440 QTC Calculation: 497 R Axis:   -75  Text Interpretation: AV dual-paced rhythm Confirmed by Aniceto Jarvis ([id number]) on 04/22/2024 11:41:21 AM   PPM Interrogation-  reviewed in detail today,  See PACEART report.  Device History: Medtronic Dual Chamber PPM implanted 06/27/11 for CHB  Risk Assessment/Calculations:              Physical Exam:   VS:  BP (!) 96/57   Pulse 77   Ht 5' (1.524 m)   Wt 147 lb (66.7 kg)   SpO2 94%   BMI 28.71 kg/m    Wt Readings from Last 3 Encounters:  04/22/24 147 lb (66.7 kg)  09/04/23 147 lb 9.6 oz (67 kg)  03/03/23 146 lb 12.8 oz (66.6 kg)     GEN: Well nourished, well developed in no acute distress NECK: No JVD; No carotid bruits CARDIAC: Regular rate and rhythm, no murmurs, rubs, gallops. PPM site wnl, no tethering RESPIRATORY:  Clear to auscultation without rales, wheezing or rhonchi  ABDOMEN: Soft, non-tender,  non-distended EXTREMITIES:  No edema; No deformity   ASSESSMENT AND PLAN:    CHB s/p Medtronic PPM  Dependent  -Normal PPM function -See Pace Art report -No changes today  CAD  -no anginal symptoms   Hypertension  -well controlled on current regimen   Disposition:   Follow up with Dr. Almetta or EP APP in 12 months > transition to new EP MD reviewed with patient.    Pt encouraged to schedule Cardiology follow up (due in 08/2023) as well.    Signed, Jarvis Aniceto, NP-C, AGACNP-BC [facility] - Electrophysiology  04/22/2024, 12:44 PM

## 2024-04-22 ENCOUNTER — Encounter: Payer: Self-pay | Admitting: Pulmonary Disease

## 2024-04-22 ENCOUNTER — Ambulatory Visit: Attending: Pulmonary Disease | Admitting: Pulmonary Disease

## 2024-04-22 VITALS — BP 96/57 | HR 77 | Ht 60.0 in | Wt 147.0 lb

## 2024-04-22 DIAGNOSIS — Z950 Presence of cardiac pacemaker: Secondary | ICD-10-CM | POA: Diagnosis not present

## 2024-04-22 DIAGNOSIS — I442 Atrioventricular block, complete: Secondary | ICD-10-CM

## 2024-04-22 LAB — CUP PACEART INCLINIC DEVICE CHECK
Date Time Interrogation Session: 20251103124639
Implantable Lead Connection Status: 753985
Implantable Lead Connection Status: 753985
Implantable Lead Implant Date: 20130107
Implantable Lead Implant Date: 20130107
Implantable Lead Location: 753859
Implantable Lead Location: 753860
Implantable Lead Model: 5076
Implantable Lead Model: 5092
Implantable Pulse Generator Implant Date: 20220502

## 2024-04-22 NOTE — Patient Instructions (Signed)
 Medication Instructions:  Your physician recommends that you continue on your current medications as directed. Please refer to the Current Medication list given to you today.  *If you need a refill on your cardiac medications before your next appointment, please call your pharmacy*  Lab Work: None ordered If you have labs (blood work) drawn today and your tests are completely normal, you will receive your results only by: MyChart Message (if you have MyChart) OR A paper copy in the mail If you have any lab test that is abnormal or we need to change your treatment, we will call you to review the results.  Follow-Up: At [facility], you and your health needs are our priority.  As part of our continuing mission to provide you with exceptional heart care, our providers are all part of one team.  This team includes your primary Cardiologist (physician) and Advanced Practice Providers or APPs (Physician Assistants and Nurse Practitioners) who all work together to provide you with the care you need, when you need it.  Your next appointment:   1 year(s)  Provider:   Donnice Primus, MD    Schedule March 2026 follow up with general cardiology APP.

## 2024-05-14 ENCOUNTER — Ambulatory Visit: Payer: Medicare HMO

## 2024-05-14 DIAGNOSIS — I442 Atrioventricular block, complete: Secondary | ICD-10-CM

## 2024-05-15 LAB — CUP PACEART REMOTE DEVICE CHECK
Battery Remaining Longevity: 58 mo
Battery Voltage: 2.96 V
Brady Statistic AP VP Percent: 67.03 %
Brady Statistic AP VS Percent: 0.01 %
Brady Statistic AS VP Percent: 32.47 %
Brady Statistic AS VS Percent: 0.49 %
Brady Statistic RA Percent Paced: 67.35 %
Brady Statistic RV Percent Paced: 99.5 %
Date Time Interrogation Session: 20251125002809
Implantable Lead Connection Status: 753985
Implantable Lead Connection Status: 753985
Implantable Lead Implant Date: 20130107
Implantable Lead Implant Date: 20130107
Implantable Lead Location: 753859
Implantable Lead Location: 753860
Implantable Lead Model: 5076
Implantable Lead Model: 5092
Implantable Pulse Generator Implant Date: 20220502
Lead Channel Impedance Value: 380 Ohm
Lead Channel Impedance Value: 437 Ohm
Lead Channel Impedance Value: 456 Ohm
Lead Channel Impedance Value: 475 Ohm
Lead Channel Pacing Threshold Amplitude: 1.5 V
Lead Channel Pacing Threshold Amplitude: 1.625 V
Lead Channel Pacing Threshold Pulse Width: 0.4 ms
Lead Channel Pacing Threshold Pulse Width: 0.4 ms
Lead Channel Sensing Intrinsic Amplitude: 2.125 mV
Lead Channel Sensing Intrinsic Amplitude: 2.125 mV
Lead Channel Sensing Intrinsic Amplitude: 3.625 mV
Lead Channel Setting Pacing Amplitude: 2.5 V
Lead Channel Setting Pacing Amplitude: 2.5 V
Lead Channel Setting Pacing Pulse Width: 0.6 ms
Lead Channel Setting Sensing Sensitivity: 0.9 mV
Zone Setting Status: 755011

## 2024-05-20 NOTE — Progress Notes (Signed)
 Remote PPM Transmission

## 2024-05-23 ENCOUNTER — Ambulatory Visit: Payer: Self-pay | Admitting: Internal Medicine

## 2024-08-13 ENCOUNTER — Ambulatory Visit: Payer: Medicare HMO

## 2024-08-19 ENCOUNTER — Ambulatory Visit: Admitting: Physician Assistant

## 2024-11-12 ENCOUNTER — Ambulatory Visit: Payer: Medicare HMO

## 2025-02-11 ENCOUNTER — Ambulatory Visit: Payer: Medicare HMO

## 2025-05-13 ENCOUNTER — Ambulatory Visit: Payer: Medicare HMO
